# Patient Record
Sex: Female | Born: 1975 | Race: White | Hispanic: No | Marital: Single | Smoking: Former smoker
Health system: Southern US, Community
[De-identification: ages and names within clinical notes are randomized; demographics above are authoritative.]

## PROBLEM LIST (undated history)

## (undated) DIAGNOSIS — F410 Panic disorder [episodic paroxysmal anxiety] without agoraphobia: Secondary | ICD-10-CM

## (undated) DIAGNOSIS — F419 Anxiety disorder, unspecified: Secondary | ICD-10-CM

## (undated) DIAGNOSIS — F319 Bipolar disorder, unspecified: Secondary | ICD-10-CM

## (undated) DIAGNOSIS — J189 Pneumonia, unspecified organism: Secondary | ICD-10-CM

## (undated) DIAGNOSIS — J449 Chronic obstructive pulmonary disease, unspecified: Secondary | ICD-10-CM

## (undated) DIAGNOSIS — F909 Attention-deficit hyperactivity disorder, unspecified type: Secondary | ICD-10-CM

## (undated) DIAGNOSIS — F4310 Post-traumatic stress disorder, unspecified: Secondary | ICD-10-CM

## (undated) DIAGNOSIS — J45909 Unspecified asthma, uncomplicated: Secondary | ICD-10-CM

## (undated) HISTORY — PX: OTHER SURGICAL HISTORY: SHX169

## (undated) HISTORY — DX: Post-traumatic stress disorder, unspecified: F43.10

## (undated) HISTORY — PX: TUBAL LIGATION: SHX77

## (undated) HISTORY — PX: ABDOMINAL HYSTERECTOMY: SHX81

---

## 2010-05-14 ENCOUNTER — Institutional Professional Consult (permissible substitution) (INDEPENDENT_AMBULATORY_CARE_PROVIDER_SITE_OTHER): Payer: Self-pay | Admitting: Internal Medicine

## 2010-06-25 ENCOUNTER — Institutional Professional Consult (permissible substitution) (INDEPENDENT_AMBULATORY_CARE_PROVIDER_SITE_OTHER): Payer: Self-pay | Admitting: Internal Medicine

## 2010-09-12 ENCOUNTER — Ambulatory Visit (INDEPENDENT_AMBULATORY_CARE_PROVIDER_SITE_OTHER): Payer: Self-pay | Admitting: Internal Medicine

## 2010-10-23 ENCOUNTER — Encounter: Payer: Self-pay | Admitting: *Deleted

## 2010-10-23 ENCOUNTER — Emergency Department (HOSPITAL_COMMUNITY)
Admission: EM | Admit: 2010-10-23 | Discharge: 2010-10-23 | Disposition: A | Discharge status: Home / Self Care | Payer: Medicaid Other | Attending: Emergency Medicine | Admitting: Emergency Medicine

## 2010-10-23 DIAGNOSIS — N949 Unspecified condition associated with female genital organs and menstrual cycle: Secondary | ICD-10-CM | POA: Insufficient documentation

## 2010-10-23 DIAGNOSIS — F17200 Nicotine dependence, unspecified, uncomplicated: Secondary | ICD-10-CM | POA: Insufficient documentation

## 2010-10-23 DIAGNOSIS — Z202 Contact with and (suspected) exposure to infections with a predominantly sexual mode of transmission: Secondary | ICD-10-CM | POA: Insufficient documentation

## 2010-10-23 DIAGNOSIS — N751 Abscess of Bartholin's gland: Secondary | ICD-10-CM | POA: Insufficient documentation

## 2010-10-23 LAB — URINALYSIS, ROUTINE W REFLEX MICROSCOPIC
Bilirubin Urine: NEGATIVE
Nitrite: NEGATIVE
Specific Gravity, Urine: 1.01 (ref 1.005–1.030)
Urobilinogen, UA: 0.2 mg/dL (ref 0.0–1.0)

## 2010-10-23 LAB — PREGNANCY, URINE: Preg Test, Ur: NEGATIVE

## 2010-10-23 LAB — URINE MICROSCOPIC-ADD ON

## 2010-10-23 MED ORDER — IBUPROFEN 800 MG PO TABS: 800.0000 mg | ORAL_TABLET | Freq: Three times a day (TID) | ORAL | Status: AC

## 2010-10-23 MED ORDER — AZITHROMYCIN 250 MG PO TABS
1000.0000 mg | ORAL_TABLET | Freq: Once | ORAL | Status: AC
  Administered 2010-10-23: 1000 mg via ORAL
  Filled 2010-10-23: qty 4

## 2010-10-23 MED ORDER — CEFTRIAXONE SODIUM 250 MG IJ SOLR
250.0000 mg | Freq: Once | INTRAMUSCULAR | Status: AC
  Administered 2010-10-23: 250 mg via INTRAMUSCULAR
  Filled 2010-10-23: qty 250

## 2010-10-23 MED ORDER — TRAMADOL HCL 50 MG PO TABS
50.0000 mg | ORAL_TABLET | Freq: Once | ORAL | Status: AC
  Administered 2010-10-23: 50 mg via ORAL
  Filled 2010-10-23: qty 1

## 2010-10-23 NOTE — ED Notes (Signed)
Pt states was seen at [facility] 3 days ago for Vaginal cyst/ abscess and painful sores in mouth. Pts # 1 complaint today is no pain medication given for post treatment at previous provider. Pt has asked 4 times for pain meds states [place] Vicodin for pain and has had a previous drug problem. Pt has packing on rt side of labia toward vaginal opening.  J.Idol PA at bedside.  Packing removed and PA spoke with pt at length secondary to the importance of treatment compliance and STD education. Pt states feels better once the packing was removed.

## 2010-10-23 NOTE — ED Provider Notes (Signed)
History     Chief Complaint  Patient presents with  . Wound Check   Patient is a 35 y.o. female presenting with wound check. The history is provided by the patient.  Wound Check  She was treated in the ED 2 to 3 days ago (Patient was seen at an outside hospital 2 days ago,  and had a Bartholins cyst drained.  She presents for removal of packing. ). Previous treatment in the ED includes I&D of abscess (She also describes redness and tenderness of the roof of her mouth,  was diagnosed with herpangina and prescribed dukes mouthwash,  has not filled yet.  She does report having oral sex with partner.). There has been no treatment (she was prescribed docycycline but will get filled today.  Also was treated with Im rocephin,  but reports has had intercourse with her partner yesterday.  He has not been checked for stds.) since the wound repair. There has been no drainage from the wound. There is no redness present. The swelling has not changed. The pain has not changed.    History reviewed. No pertinent past medical history.  Past Surgical History  Procedure Date  . Tubal ligation     History reviewed. No pertinent family history.  History  Substance Use Topics  . Smoking status: Current Everyday Smoker -- 0.5 packs/day for 20 years    Types: Cigarettes  . Smokeless tobacco: Not on file  . Alcohol Use: No    OB History    Grav Para Term Preterm Abortions TAB SAB Ect Mult Living   4 4              Review of Systems  Constitutional: Negative for fever.  HENT: Negative for congestion, sore throat and neck pain.   Eyes: Negative.   Respiratory: Negative for chest tightness and shortness of breath.   Cardiovascular: Negative for chest pain.  Gastrointestinal: Negative for nausea and abdominal pain.  Genitourinary: Positive for vaginal pain. Negative for vaginal bleeding, difficulty urinating, menstrual problem and pelvic pain.  Musculoskeletal: Negative.  Negative for joint swelling  and arthralgias.  Skin: Negative.  Negative for rash and wound.  Neurological: Negative.  Negative for dizziness, weakness, light-headedness, numbness and headaches.  Hematological: Negative.   Psychiatric/Behavioral: Negative.     Physical Exam  BP 111/68  Pulse 97  Temp(Src) 98.4 F (36.9 C) (Oral)  Resp 18  Ht 5\' 2"  (1.575 m)  Wt 148 lb (67.132 kg)  BMI 27.07 kg/m2  SpO2 99%  LMP 10/09/2010  Physical Exam  Vitals reviewed. Constitutional: She is oriented to person, place, and time. She appears well-developed and well-nourished.  HENT:  Head: Normocephalic and atraumatic.  Mouth/Throat:    Eyes: Conjunctivae are normal.  Neck: Normal range of motion. No thyromegaly present.  Cardiovascular: Normal rate and regular rhythm.   Pulmonary/Chest: Effort normal and breath sounds normal. She has no wheezes.  Abdominal: Soft. Bowel sounds are normal. There is no tenderness.  Genitourinary:    There is tenderness on the right labia.  Musculoskeletal: Normal range of motion.  Neurological: She is alert and oriented to person, place, and time.  Skin: Skin is warm and dry.  Psychiatric: She has a normal mood and affect.    ED Course  Procedures  MDM  Patient was treated for gonorrhea with rocephin injection 2 days ago,  But reexposed to partner yesterday.  Will retreat for gc/chlamydia.  Patient encouraged to get her doxycycline filled as prescribed by her  provider at [facility].     Candis Musa, PA 10/26/10 1008
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
## 2010-10-23 NOTE — ED Notes (Signed)
Pt states that she had a cyst drained at [facility] on the 10/21/10 in her vaginal area. Pt wants packing removed. States that she wanted to come her and didn't want to go back to [place]. C/o pain with movement and urinating.

## 2010-10-28 NOTE — ED Provider Notes (Signed)
Medical screening examination/treatment/procedure(s) were performed by non-physician practitioner and as supervising physician I was immediately available for consultation/collaboration.   Lyanne Co, MD 10/28/10 [phone]

## 2011-08-27 ENCOUNTER — Emergency Department (HOSPITAL_COMMUNITY): Payer: Medicaid Other

## 2011-08-27 ENCOUNTER — Emergency Department (HOSPITAL_COMMUNITY)
Admission: EM | Admit: 2011-08-27 | Discharge: 2011-08-27 | Disposition: A | Discharge status: Home / Self Care | Payer: Medicaid Other | Attending: Emergency Medicine | Admitting: Emergency Medicine

## 2011-08-27 ENCOUNTER — Encounter (HOSPITAL_COMMUNITY): Payer: Self-pay

## 2011-08-27 DIAGNOSIS — F17200 Nicotine dependence, unspecified, uncomplicated: Secondary | ICD-10-CM | POA: Insufficient documentation

## 2011-08-27 DIAGNOSIS — F319 Bipolar disorder, unspecified: Secondary | ICD-10-CM | POA: Insufficient documentation

## 2011-08-27 DIAGNOSIS — R220 Localized swelling, mass and lump, head: Secondary | ICD-10-CM | POA: Insufficient documentation

## 2011-08-27 DIAGNOSIS — S0003XA Contusion of scalp, initial encounter: Secondary | ICD-10-CM | POA: Insufficient documentation

## 2011-08-27 DIAGNOSIS — S0083XA Contusion of other part of head, initial encounter: Secondary | ICD-10-CM | POA: Insufficient documentation

## 2011-08-27 DIAGNOSIS — F410 Panic disorder [episodic paroxysmal anxiety] without agoraphobia: Secondary | ICD-10-CM | POA: Insufficient documentation

## 2011-08-27 HISTORY — DX: Panic disorder (episodic paroxysmal anxiety): F41.0

## 2011-08-27 HISTORY — DX: Bipolar disorder, unspecified: F31.9

## 2011-08-27 HISTORY — DX: Anxiety disorder, unspecified: F41.9

## 2011-08-27 MED ORDER — PROMETHAZINE HCL 12.5 MG PO TABS
12.5000 mg | ORAL_TABLET | Freq: Once | ORAL | Status: AC
  Administered 2011-08-27: 12.5 mg via ORAL
  Filled 2011-08-27: qty 1

## 2011-08-27 MED ORDER — HYDROCODONE-ACETAMINOPHEN 5-325 MG PO TABS
2.0000 | ORAL_TABLET | Freq: Once | ORAL | Status: AC
  Administered 2011-08-27: 2 via ORAL
  Filled 2011-08-27: qty 2

## 2011-08-27 MED ORDER — HYDROCODONE-ACETAMINOPHEN 7.5-325 MG PO TABS: 1.0000 | ORAL_TABLET | ORAL | Status: AC | PRN

## 2011-08-27 NOTE — Discharge Instructions (Signed)
Your CT is negative for fracture or dislocation. There are areas of swelling and hematoma present. Please apply ice to the left face today and tomorrow for 15 minute intervals. Please use Tylenol or Motrin for mild pain, please use Norco for more severe pain. This medication may cause drowsiness, or constipation. Please use with caution.Contusion A contusion is a deep bruise. Contusions are the result of an injury that caused bleeding under the skin. The contusion may turn blue, purple, or yellow. Minor injuries will give you a painless contusion, but more severe contusions may stay painful and swollen for a few weeks.  CAUSES  A contusion is usually caused by a blow, trauma, or direct force to an area of the body. SYMPTOMS   Swelling and redness of the injured area.   Bruising of the injured area.   Tenderness and soreness of the injured area.   Pain.  DIAGNOSIS  The diagnosis can be made by taking a history and physical exam. An X-ray, CT scan, or MRI may be needed to determine if there were any associated injuries, such as fractures. TREATMENT  Specific treatment will depend on what area of the body was injured. In general, the best treatment for a contusion is resting, icing, elevating, and applying cold compresses to the injured area. Over-the-counter medicines may also be recommended for pain control. Ask your caregiver what the best treatment is for your contusion. HOME CARE INSTRUCTIONS   Put ice on the injured area.   Put ice in a plastic bag.   Place a towel between your skin and the bag.   Leave the ice on for 15 to 20 minutes, 3 to 4 times a day.   Only take over-the-counter or prescription medicines for pain, discomfort, or fever as directed by your caregiver. Your caregiver may recommend avoiding anti-inflammatory medicines (aspirin, ibuprofen, and naproxen) for 48 hours because these medicines may increase bruising.   Rest the injured area.   If possible, elevate the  injured area to reduce swelling.  SEEK IMMEDIATE MEDICAL CARE IF:   You have increased bruising or swelling.   You have pain that is getting worse.   Your swelling or pain is not relieved with medicines.  MAKE SURE YOU:   Understand these instructions.   Will watch your condition.   Will get help right away if you are not doing well or get worse.  Document Released: 01/02/2005 Document Revised: 03/14/2011 Document Reviewed: 01/28/2011 [facility] Patient Information 2012 [place], [place].
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
## 2011-08-31 NOTE — ED Provider Notes (Signed)
Medical screening examination/treatment/procedure(s) were performed by non-physician practitioner and as supervising physician I was immediately available for consultation/collaboration. Devoria Albe, MD, Armando Gang   Ward Givens, MD 08/31/11 2231

## 2013-01-10 ENCOUNTER — Encounter (HOSPITAL_COMMUNITY): Payer: Self-pay | Admitting: Emergency Medicine

## 2013-01-10 ENCOUNTER — Emergency Department (HOSPITAL_COMMUNITY)
Admission: EM | Admit: 2013-01-10 | Discharge: 2013-01-10 | Disposition: A | Discharge status: Home / Self Care | Payer: Medicaid Other | Attending: Emergency Medicine | Admitting: Emergency Medicine

## 2013-01-10 DIAGNOSIS — M545 Low back pain: Secondary | ICD-10-CM

## 2013-01-10 DIAGNOSIS — Y929 Unspecified place or not applicable: Secondary | ICD-10-CM | POA: Insufficient documentation

## 2013-01-10 DIAGNOSIS — Y9389 Activity, other specified: Secondary | ICD-10-CM | POA: Insufficient documentation

## 2013-01-10 DIAGNOSIS — X500XXA Overexertion from strenuous movement or load, initial encounter: Secondary | ICD-10-CM | POA: Insufficient documentation

## 2013-01-10 DIAGNOSIS — F17200 Nicotine dependence, unspecified, uncomplicated: Secondary | ICD-10-CM | POA: Insufficient documentation

## 2013-01-10 DIAGNOSIS — S93401A Sprain of unspecified ligament of right ankle, initial encounter: Secondary | ICD-10-CM

## 2013-01-10 DIAGNOSIS — Z8659 Personal history of other mental and behavioral disorders: Secondary | ICD-10-CM | POA: Insufficient documentation

## 2013-01-10 DIAGNOSIS — IMO0002 Reserved for concepts with insufficient information to code with codable children: Secondary | ICD-10-CM | POA: Insufficient documentation

## 2013-01-10 DIAGNOSIS — S93409A Sprain of unspecified ligament of unspecified ankle, initial encounter: Secondary | ICD-10-CM | POA: Insufficient documentation

## 2013-01-10 MED ORDER — ACETAMINOPHEN-CODEINE #3 300-30 MG PO TABS
2.0000 | ORAL_TABLET | Freq: Once | ORAL | Status: AC
Start: 2013-01-10 — End: 2013-01-10
  Administered 2013-01-10: 2 via ORAL
  Filled 2013-01-10: qty 2

## 2013-01-10 MED ORDER — DICLOFENAC SODIUM 75 MG PO TBEC: 75.0000 mg | DELAYED_RELEASE_TABLET | Freq: Two times a day (BID) | ORAL | Status: DC

## 2013-01-10 MED ORDER — ONDANSETRON HCL 4 MG PO TABS
4.0000 mg | ORAL_TABLET | Freq: Once | ORAL | Status: AC
  Administered 2013-01-10: 4 mg via ORAL
  Filled 2013-01-10: qty 1

## 2013-01-10 MED ORDER — KETOROLAC TROMETHAMINE 10 MG PO TABS
10.0000 mg | ORAL_TABLET | Freq: Once | ORAL | Status: AC
  Administered 2013-01-10: 10 mg via ORAL
  Filled 2013-01-10: qty 1

## 2013-01-10 MED ORDER — ACETAMINOPHEN-CODEINE #3 300-30 MG PO TABS: 1.0000 | ORAL_TABLET | Freq: Four times a day (QID) | ORAL | Status: DC | PRN

## 2013-01-10 NOTE — ED Notes (Signed)
Pt upset after ASO placed on her right ankle and crutches given. Pt asking to see DR, as she does not believe the ASO is going to be enough support for said injury. Pt asking for a " big boot" like she got in January for another injury. EDP notified.
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
## 2013-01-10 NOTE — ED Notes (Signed)
Pt asking to see EDP as she feels she needs pain medication before leaving the hospital.  EDP notified.
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
## 2013-01-12 NOTE — ED Provider Notes (Signed)
Medical screening examination/treatment/procedure(s) were performed by non-physician practitioner and as supervising physician I was immediately available for consultation/collaboration.  Reyaansh Merlo M Bee Hammerschmidt, MD 01/12/13 0647 

## 2014-02-07 ENCOUNTER — Encounter (HOSPITAL_COMMUNITY): Payer: Self-pay | Admitting: Emergency Medicine

## 2014-07-15 ENCOUNTER — Inpatient Hospital Stay (HOSPITAL_COMMUNITY)
Admission: EM | Admit: 2014-07-15 | Discharge: 2014-07-16 | DRG: 917 | Disposition: A | Discharge status: Other Acute Inpatient Hospital | Payer: Medicaid Other | Attending: Internal Medicine | Admitting: Internal Medicine

## 2014-07-15 ENCOUNTER — Encounter (HOSPITAL_COMMUNITY): Payer: Self-pay | Admitting: Emergency Medicine

## 2014-07-15 DIAGNOSIS — E876 Hypokalemia: Secondary | ICD-10-CM | POA: Diagnosis present

## 2014-07-15 DIAGNOSIS — F10129 Alcohol abuse with intoxication, unspecified: Secondary | ICD-10-CM | POA: Diagnosis present

## 2014-07-15 DIAGNOSIS — R404 Transient alteration of awareness: Secondary | ICD-10-CM | POA: Diagnosis not present

## 2014-07-15 DIAGNOSIS — F410 Panic disorder [episodic paroxysmal anxiety] without agoraphobia: Secondary | ICD-10-CM | POA: Diagnosis present

## 2014-07-15 DIAGNOSIS — F17210 Nicotine dependence, cigarettes, uncomplicated: Secondary | ICD-10-CM | POA: Diagnosis present

## 2014-07-15 DIAGNOSIS — T50904A Poisoning by unspecified drugs, medicaments and biological substances, undetermined, initial encounter: Secondary | ICD-10-CM | POA: Diagnosis not present

## 2014-07-15 DIAGNOSIS — I959 Hypotension, unspecified: Secondary | ICD-10-CM | POA: Diagnosis present

## 2014-07-15 DIAGNOSIS — G9340 Encephalopathy, unspecified: Secondary | ICD-10-CM | POA: Diagnosis present

## 2014-07-15 DIAGNOSIS — T50901A Poisoning by unspecified drugs, medicaments and biological substances, accidental (unintentional), initial encounter: Secondary | ICD-10-CM | POA: Diagnosis present

## 2014-07-15 DIAGNOSIS — T391X1A Poisoning by 4-Aminophenol derivatives, accidental (unintentional), initial encounter: Secondary | ICD-10-CM | POA: Diagnosis present

## 2014-07-15 DIAGNOSIS — I9589 Other hypotension: Secondary | ICD-10-CM

## 2014-07-15 DIAGNOSIS — T43211A Poisoning by selective serotonin and norepinephrine reuptake inhibitors, accidental (unintentional), initial encounter: Principal | ICD-10-CM | POA: Diagnosis present

## 2014-07-15 DIAGNOSIS — F314 Bipolar disorder, current episode depressed, severe, without psychotic features: Secondary | ICD-10-CM | POA: Diagnosis not present

## 2014-07-15 DIAGNOSIS — F515 Nightmare disorder: Secondary | ICD-10-CM | POA: Diagnosis present

## 2014-07-15 DIAGNOSIS — G92 Toxic encephalopathy: Secondary | ICD-10-CM | POA: Diagnosis present

## 2014-07-15 DIAGNOSIS — T43212A Poisoning by selective serotonin and norepinephrine reuptake inhibitors, intentional self-harm, initial encounter: Secondary | ICD-10-CM | POA: Diagnosis not present

## 2014-07-15 DIAGNOSIS — F319 Bipolar disorder, unspecified: Secondary | ICD-10-CM | POA: Diagnosis present

## 2014-07-15 DIAGNOSIS — Y906 Blood alcohol level of 120-199 mg/100 ml: Secondary | ICD-10-CM | POA: Diagnosis present

## 2014-07-15 DIAGNOSIS — R4182 Altered mental status, unspecified: Secondary | ICD-10-CM | POA: Diagnosis present

## 2014-07-15 DIAGNOSIS — T1491 Suicide attempt: Secondary | ICD-10-CM | POA: Diagnosis not present

## 2014-07-15 DIAGNOSIS — F1020 Alcohol dependence, uncomplicated: Secondary | ICD-10-CM | POA: Diagnosis not present

## 2014-07-15 DIAGNOSIS — F1920 Other psychoactive substance dependence, uncomplicated: Secondary | ICD-10-CM | POA: Diagnosis not present

## 2014-07-15 DIAGNOSIS — F1994 Other psychoactive substance use, unspecified with psychoactive substance-induced mood disorder: Secondary | ICD-10-CM | POA: Diagnosis not present

## 2014-07-15 DIAGNOSIS — F10920 Alcohol use, unspecified with intoxication, uncomplicated: Secondary | ICD-10-CM

## 2014-07-15 LAB — URINALYSIS, ROUTINE W REFLEX MICROSCOPIC
Bilirubin Urine: NEGATIVE
Glucose, UA: NEGATIVE mg/dL
HGB URINE DIPSTICK: NEGATIVE
Ketones, ur: NEGATIVE mg/dL
LEUKOCYTES UA: NEGATIVE
NITRITE: NEGATIVE
PROTEIN: NEGATIVE mg/dL
UROBILINOGEN UA: 0.2 mg/dL (ref 0.0–1.0)
pH: 5.5 (ref 5.0–8.0)

## 2014-07-15 LAB — COMPREHENSIVE METABOLIC PANEL
ALBUMIN: 3.8 g/dL (ref 3.5–5.2)
ALT: 31 U/L (ref 0–35)
AST: 31 U/L (ref 0–37)
Alkaline Phosphatase: 64 U/L (ref 39–117)
Anion gap: 12 (ref 5–15)
BUN: 5 mg/dL — ABNORMAL LOW (ref 6–23)
CO2: 23 mmol/L (ref 19–32)
CREATININE: 0.71 mg/dL (ref 0.50–1.10)
Calcium: 8.7 mg/dL (ref 8.4–10.5)
Chloride: 103 mmol/L (ref 96–112)
GFR calc Af Amer: >90 mL/min (ref >90)
GLUCOSE: 96 mg/dL (ref 70–99)
Potassium: 3 mmol/L — ABNORMAL LOW (ref 3.5–5.1)
SODIUM: 138 mmol/L (ref 135–145)
Total Bilirubin: 0.4 mg/dL (ref 0.3–1.2)
Total Protein: 7.4 g/dL (ref 6.0–8.3)

## 2014-07-15 LAB — CBC WITH DIFFERENTIAL/PLATELET
BASOS ABS: 0 10*3/uL (ref 0.0–0.1)
Basophils Relative: 0 % (ref 0–1)
Eosinophils Absolute: 0 10*3/uL (ref 0.0–0.7)
Eosinophils Relative: 0 % (ref 0–5)
HCT: 44 % (ref 36.0–46.0)
Hemoglobin: 15.7 g/dL — ABNORMAL HIGH (ref 12.0–15.0)
Lymphocytes Relative: 33 % (ref 12–46)
Lymphs Abs: 3 10*3/uL (ref 0.7–4.0)
MCH: 33.1 pg (ref 26.0–34.0)
MCHC: 35.7 g/dL (ref 30.0–36.0)
MCV: 92.8 fL (ref 78.0–100.0)
Monocytes Absolute: 0.5 10*3/uL (ref 0.1–1.0)
Monocytes Relative: 5 % (ref 3–12)
NEUTROS ABS: 5.5 10*3/uL (ref 1.7–7.7)
Neutrophils Relative %: 62 % (ref 43–77)
PLATELETS: 274 10*3/uL (ref 150–400)
RBC: 4.74 MIL/uL (ref 3.87–5.11)
RDW: 13.5 % (ref 11.5–15.5)
WBC: 9 10*3/uL (ref 4.0–10.5)

## 2014-07-15 LAB — ETHANOL: Alcohol, Ethyl (B): 141 mg/dL — ABNORMAL HIGH (ref 0–9)

## 2014-07-15 LAB — RAPID URINE DRUG SCREEN, HOSP PERFORMED
Amphetamines: NOT DETECTED
Barbiturates: NOT DETECTED
Benzodiazepines: NOT DETECTED
COCAINE: POSITIVE — AB
OPIATES: POSITIVE — AB
Tetrahydrocannabinol: NOT DETECTED

## 2014-07-15 LAB — ACETAMINOPHEN LEVEL

## 2014-07-15 LAB — POC URINE PREG, ED: Preg Test, Ur: NEGATIVE

## 2014-07-15 LAB — SALICYLATE LEVEL: Salicylate Lvl: <4 mg/dL (ref 2.8–20.0)

## 2014-07-15 LAB — MRSA PCR SCREENING: MRSA BY PCR: POSITIVE — AB

## 2014-07-15 MED ORDER — SODIUM CHLORIDE 0.9 % IV SOLN
INTRAVENOUS | Status: AC
  Administered 2014-07-15: 11:00:00 via INTRAVENOUS
  Administered 2014-07-15: 150 mL/h via INTRAVENOUS

## 2014-07-15 MED ORDER — NALOXONE HCL 1 MG/ML IJ SOLN
INTRAMUSCULAR | Status: AC
  Filled 2014-07-15: qty 4

## 2014-07-15 MED ORDER — NALOXONE HCL 1 MG/ML IJ SOLN
INTRAMUSCULAR | Status: AC
  Filled 2014-07-15: qty 2

## 2014-07-15 MED ORDER — SODIUM CHLORIDE 0.9 % IV BOLUS (SEPSIS)
1000.0000 mL | Freq: Once | INTRAVENOUS | Status: AC
  Administered 2014-07-15: 1000 mL via INTRAVENOUS

## 2014-07-15 MED ORDER — SODIUM CHLORIDE 0.9 % IV SOLN
1000.0000 mL | Freq: Once | INTRAVENOUS | Status: AC
  Administered 2014-07-15: 1000 mL via INTRAVENOUS

## 2014-07-15 MED ORDER — NALOXONE HCL 1 MG/ML IJ SOLN
2.0000 mg | Freq: Once | INTRAMUSCULAR | Status: AC
  Administered 2014-07-15: 2 mg via INTRAVENOUS

## 2014-07-15 MED ORDER — SODIUM CHLORIDE 0.9 % IJ SOLN: 3.0000 mL | Freq: Two times a day (BID) | INTRAMUSCULAR | Status: DC

## 2014-07-15 MED ORDER — SODIUM CHLORIDE 0.9 % IV SOLN: 250.0000 mL | INTRAVENOUS | Status: DC | PRN

## 2014-07-15 MED ORDER — ONDANSETRON HCL 4 MG/2ML IJ SOLN: 4.0000 mg | Freq: Four times a day (QID) | INTRAMUSCULAR | Status: DC | PRN

## 2014-07-15 MED ORDER — MUPIROCIN 2 % EX OINT
1.0000 "application " | TOPICAL_OINTMENT | Freq: Two times a day (BID) | CUTANEOUS | Status: DC
  Administered 2014-07-15 – 2014-07-16 (×3): 1 via NASAL
  Filled 2014-07-15: qty 22

## 2014-07-15 MED ORDER — CHLORHEXIDINE GLUCONATE CLOTH 2 % EX PADS
6.0000 | MEDICATED_PAD | Freq: Every day | CUTANEOUS | Status: DC
  Administered 2014-07-15 – 2014-07-16 (×2): 6 via TOPICAL

## 2014-07-15 MED ORDER — SODIUM CHLORIDE 0.9 % IV SOLN
1000.0000 mL | INTRAVENOUS | Status: DC
  Administered 2014-07-15 – 2014-07-16 (×3): 1000 mL via INTRAVENOUS

## 2014-07-15 MED ORDER — ENOXAPARIN SODIUM 40 MG/0.4ML ~~LOC~~ SOLN
40.0000 mg | SUBCUTANEOUS | Status: DC
  Administered 2014-07-15: 40 mg via SUBCUTANEOUS
  Filled 2014-07-15 (×2): qty 0.4

## 2014-07-15 MED ORDER — SODIUM CHLORIDE 0.9 % IJ SOLN
3.0000 mL | INTRAMUSCULAR | Status: DC | PRN
Start: 2014-07-15 — End: 2014-07-16

## 2014-07-15 MED ORDER — POTASSIUM CHLORIDE 10 MEQ/100ML IV SOLN
10.0000 meq | Freq: Once | INTRAVENOUS | Status: AC
  Administered 2014-07-15: 10 meq via INTRAVENOUS
  Filled 2014-07-15: qty 100

## 2014-07-15 MED ORDER — POTASSIUM CHLORIDE 10 MEQ/100ML IV SOLN
10.0000 meq | INTRAVENOUS | Status: AC
  Administered 2014-07-15 (×3): 10 meq via INTRAVENOUS
  Filled 2014-07-15 (×3): qty 100

## 2014-07-15 MED ORDER — NALOXONE HCL 1 MG/ML IJ SOLN
1.3000 mg/h | INTRAVENOUS | Status: DC
  Administered 2014-07-15 (×3): 1.3 mg/h via INTRAVENOUS
  Filled 2014-07-15 (×2): qty 4

## 2014-07-15 MED ORDER — ONDANSETRON HCL 4 MG PO TABS: 4.0000 mg | ORAL_TABLET | Freq: Four times a day (QID) | ORAL | Status: DC | PRN
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
## 2014-07-15 NOTE — Plan of Care (Signed)
Problem: Phase I Progression Outcomes Goal: OOB as tolerated unless otherwise ordered Outcome: Progressing Pt remains drowsy but arousable.

## 2014-07-15 NOTE — ED Provider Notes (Signed)
CSN: [id number]     Arrival date & time 07/15/14  0050 History   First MD Initiated Contact with Patient 07/15/14 0055     Chief Complaint  Patient presents with  . Drug Overdose     (Consider location/radiation/quality/duration/timing/severity/associated sxs/prior Treatment) Patient is a 39 y.o. female presenting with Overdose. The history is provided by the EMS personnel and the patient. The history is limited by the condition of the patient (Altered mental status).  Drug Overdose  She was brought to the ED by ambulance after apparently taking an overdose of trazodone and opioids as well as consuming alcohol. Patient states that she took 2 trazodone admitted to drinking but denies taking any other medications. EMS did treat her with naloxone 2 mg with improvement in mental status, but she is starting to become more up tended again. Patient denies suicidal intent.  Past Medical History  Diagnosis Date  . Bipolar 1 disorder   . Panic attack   . Anxiety    Past Surgical History  Procedure Laterality Date  . Tubal ligation     History reviewed. No pertinent family history. History  Substance Use Topics  . Smoking status: Current Every Day Smoker -- 0.50 packs/day for 20 years    Types: Cigarettes  . Smokeless tobacco: Never Used  . Alcohol Use: No   OB History    Gravida Para Term Preterm AB TAB SAB Ectopic Multiple Living   Review of Systems  Unable to perform ROS: Mental status change      Allergies  Geodon  Home Medications   Prior to Admission medications   Medication Sig Start Date End Date Taking? Authorizing Provider  acetaminophen-codeine (TYLENOL #3) 300-30 MG per tablet Take 1-2 tablets by mouth every 6 (six) hours as needed for pain. 01/10/13   Ivery Quale, PA-C  diclofenac (VOLTAREN) 75 MG EC tablet Take 1 tablet (75 mg total) by mouth 2 (two) times daily. 01/10/13   Ivery Quale, PA-C   BP 86/51 mmHg  Pulse 76  Temp(Src) 98.6 F (37  C) (Oral)  Resp 16  Ht  (1.575 m)  Wt 150 lb (68.04 kg)  BMI 27.43 kg/m2  SpO2 97% Physical Exam  Nursing note and vitals reviewed.  39 year old female, somnolent but arousable, and in no acute distress. Vital signs are normal. Oxygen saturation is 96%, which is normal. Head is normocephalic and atraumatic. PERRLA, EOMI. Oropharynx is clear. Neck is nontender and supple without adenopathy or JVD. Back is nontender and there is no CVA tenderness. Lungs are clear without rales, wheezes, or rhonchi. Chest is nontender. Heart has regular rate and rhythm without murmur. Abdomen is soft, flat, nontender without masses or hepatosplenomegaly and peristalsis is normoactive. Extremities have no cyanosis or edema, full range of motion is present. Skin is warm and dry without rash. Neurologic: She is somnolent but arousable, speech is slurred, cranial nerves are intact, there are no motor or sensory deficits.  ED Course  Procedures (including critical care time) Labs Review Results for orders placed or performed during the hospital encounter of 07/15/14  Acetaminophen level  Result Value Ref Range   Acetaminophen (Tylenol), Serum <10.0 (L) 10 - 30 ug/mL  Comprehensive metabolic panel  Result Value Ref Range   Sodium 138 135 - 145 mmol/L   Potassium 3.0 (L) 3.5 - 5.1 mmol/L   Chloride 103 96 - 112 mmol/L   CO2  23 19 - 32 mmol/L   Glucose, Bld 96 70 - 99 mg/dL   BUN 5 (L) 6 - 23 mg/dL   Creatinine, Ser 4.090.71 0.50 - 1.10 mg/dL   Calcium 8.7 8.4 - 81.110.5 mg/dL   Total Protein 7.4 6.0 - 8.3 g/dL   Albumin 3.8 3.5 - 5.2 g/dL   AST 31 0 - 37 U/L   ALT 31 0 - 35 U/L   Alkaline Phosphatase 64 39 - 117 U/L   Total Bilirubin 0.4 0.3 - 1.2 mg/dL   GFR calc non Af Amer >90 >90 mL/min   GFR calc Af Amer >90 >90 mL/min   Anion gap 12 5 - 15  Ethanol  Result Value Ref Range   Alcohol, Ethyl (B) 141 (H) 0 - 9 mg/dL  Salicylate level  Result Value Ref Range   Salicylate Lvl <4.0 2.8 - 20.0  mg/dL  CBC with Differential  Result Value Ref Range   WBC 9.0 4.0 - 10.5 K/uL   RBC 4.74 3.87 - 5.11 MIL/uL   Hemoglobin 15.7 (H) 12.0 - 15.0 g/dL   HCT 91.444.0 78.236.0 - 95.646.0 %   MCV 92.8 78.0 - 100.0 fL   MCH 33.1 26.0 - 34.0 pg   MCHC 35.7 30.0 - 36.0 g/dL   RDW 21.313.5 08.611.5 - 57.815.5 %   Platelets 274 150 - 400 K/uL   Neutrophils Relative % 62 43 - 77 %   Neutro Abs 5.5 1.7 - 7.7 K/uL   Lymphocytes Relative 33 12 - 46 %   Lymphs Abs 3.0 0.7 - 4.0 K/uL   Monocytes Relative 5 3 - 12 %   Monocytes Absolute 0.5 0.1 - 1.0 K/uL   Eosinophils Relative 0 0 - 5 %   Eosinophils Absolute 0.0 0.0 - 0.7 K/uL   Basophils Relative 0 0 - 1 %   Basophils Absolute 0.0 0.0 - 0.1 K/uL  POC urine preg, ED  Result Value Ref Range   Preg Test, Ur NEGATIVE NEGATIVE    EKG Interpretation   Date/Time:  Friday July 15 2014 01:10:04 EDT Ventricular Rate:  75 PR Interval:  135 QRS Duration: 75 QT Interval:  427 QTC Calculation: 477 R Axis:   60 Text Interpretation:  Sinus rhythm Normal ECG No old tracing to compare  Confirmed by [facility]  MD, Alante Tolan ([id number]) on 07/15/2014 1:12:08 AM      CRITICAL CARE Performed by: XBMWU,XLKGMGLICK,Ensley Blas Total critical care time: 90 minutes Critical care time was exclusive of separately billable procedures and treating other patients. Critical care was necessary to treat or prevent imminent or life-threatening deterioration. Critical care was time spent personally by me on the following activities: development of treatment plan with patient and/or surrogate as well as nursing, discussions with consultants, evaluation of patient's response to treatment, examination of patient, obtaining history from patient or surrogate, ordering and performing treatments and interventions, ordering and review of laboratory studies, ordering and review of radiographic studies, pulse oximetry and re-evaluation of patient's condition.  MDM   Final diagnoses:  Drug overdose, undetermined intent, initial  encounter  Hypokalemia  Alcohol intoxication, uncomplicated  Other specified hypotension    Drug overdose. Response and all oxygen indicates narcotics were among the things that she total. Screening labs have been obtained and she will be started on a naloxone infusion. Old records are reviewed and she has no relevant past visits. [place] shows last narcotic prescription was filled to  break 22nd for 10 oxycodone-acetaminophen 5-325, but she is getting monthly prescriptions for 120 alprazolam 1 mg tablets with the last prescription filled on March 28.  Alcohol level has come back only moderately elevated. Acetaminophen and salicylate are undetectable. Hyperkalemia started and she is given intravenous potassium. Lactic acid level is still pending. Blood pressure did drop into the 80s and she is given additional IV fluids. She remains somewhat somnolent but continues to maintain adequate oxygen saturation and is protecting her airway. Nursing apparently had contacted the patient's mother who stated that she cut her wrist several days ago and had been expressing suicidal and homicidal ideation at home. Mother has not been in the room at any time that I've been in to see her. Case is discussed with Dr. Sharl Ma of [facility] who agrees to admit the patient.  Dione Booze, MD 07/15/14 [phone]

## 2014-07-15 NOTE — Plan of Care (Signed)
Problem: Phase I Progression Outcomes Goal: Other Phase I Outcomes/Goals Outcome: Not Progressing Pt remians drowsy from over dose. Remains on iv narcan drip and has a suicide sitter at bedside.

## 2014-07-15 NOTE — Progress Notes (Signed)
NARCAN DRIP STOPPED PER VERBAL ORDER WHEN CURRENT BAG COMPLETED.

## 2014-07-15 NOTE — ED Notes (Signed)
Patient ambulatory to and from restroom.

## 2014-07-15 NOTE — ED Notes (Signed)
Pt has taken unknown amount of trazadone, opioids, and etoh.

## 2014-07-15 NOTE — Clinical Social Work Note (Signed)
Patient is currently on Narcan drip and is not currently able to participate in her assessment. Patient will need TTS consult.    Tretha SciaraHeather Taisei Bonnette, Kentucky[place]CS[place] [phone]

## 2014-07-15 NOTE — Progress Notes (Signed)
Patient seen and examined. Admitted earlier today with a trazodone and Percocet overdose. Unknown if this was a suicide attempt. Patient remains with encephalopathy related to this. Continue care in the ICU on a Narcan drip for now. Will consider psychiatry evaluation once patient more alert. Since she took an unknown quantity of Percocet, will check Tylenol levels. It was noted to be less than 10 at 1 AM. Hypokalemia has been repleted; recheck be met in the morning.  Domingo Mend, MD [facility] Pager: [phone]

## 2014-07-15 NOTE — Progress Notes (Signed)
PT CONTINUES TO SLEEP THROUGH THE DAY. VS ARE STABLE. NORMAL RESPIRATIONS,O2 SAT 96 TO 98% ON ROOM AIR. NO DISTRESS OBSERVED. SKIN REMAINS WARM AND DRY.PT IS AROUSABLE. SUICIDE SITTER REMAINS AT BEDSIDE 1:1 FOR PT'S SAFETY.

## 2014-07-15 NOTE — Care Management Utilization Note (Signed)
UR completed 

## 2014-07-15 NOTE — H&P (Signed)
PCP:   No PCP Per Patient   Chief Complaint:  Altered mental status  HPI: 39 year old female who   has a past medical history of Bipolar 1 disorder; Panic attack; and Anxiety. today was brought to the ED by ambulance after patient took overdose of trazodone and opioids, as well as consuming alcohol. Patient is currently somnolent and arousable but unable to provide any significant history. Patient was given naloxone by the EMS, which improved her mental status. Patient was started on naloxone infusion by the ED physician as patient continued to be lethargic. As per the nursing notes, patient's mother states that patient cut her wrist 2 days ago. There is suicidal ideation with nightmares. Dr. Preston Fleeting reviewed [facility] controlled substance reporting website, which showed last now or depression the patient was filled on 22nd for  10 oxycodone acetaminophen, also getting monthly prescriptions for 121 is from 1 mg tablet with last prescription filled on March 28. There is no family present at bedside  So history was  obtained from the ED notes. Allergies:   Allergies  Allergen Reactions  . Geodon [Ziprasidone Hydrochloride] Other (See Comments)    Patient states that she swallows tongue      Past Medical History  Diagnosis Date  . Bipolar 1 disorder   . Panic attack   . Anxiety     Past Surgical History  Procedure Laterality Date  . Tubal ligation      Prior to Admission medications   Medication Sig Start Date End Date Taking? Authorizing Provider  acetaminophen-codeine (TYLENOL #3) 300-30 MG per tablet Take 1-2 tablets by mouth every 6 (six) hours as needed for pain. 01/10/13   Ivery Quale, PA-C  diclofenac (VOLTAREN) 75 MG EC tablet Take 1 tablet (75 mg total) by mouth 2 (two) times daily. 01/10/13   Ivery Quale, PA-C    Social History:  reports that she has been smoking Cigarettes.  She has a 10 pack-year smoking history. She has never used smokeless tobacco. She  reports that she does not drink alcohol or use illicit drugs.  Family history Unobtainable at this time due to patient's altered mental status.  Review of Systems:  Unable to obtain due to the patient's altered mental status.   Physical Exam: Blood pressure 86/51, pulse 76, temperature 98.6 F (37 C), temperature source Oral, resp. rate 16, height  (1.575 m), weight 68.04 kg (150 lb), SpO2 97 %. Constitutional:   Patient is a well-developed and well-nourished female in no acute distress and lethargic Head: Normocephalic and atraumatic Mouth: Mucus membranes moist Eyes: PERRL, EOMI, conjunctivae normal Neck: Supple, No Thyromegaly Cardiovascular: RRR, S1 normal, S2 normal Pulmonary/Chest: CTAB, no wheezes, rales, or rhonchi Abdominal: Soft. Non-tender, non-distended, bowel sounds are normal, no masses, organomegaly, or guarding present.  Neurological: Somnolent but arousable, moving all extremities.r deficit, sensory intact to light touch bilaterally.  Extremities : No Cyanosis, Clubbing or Edema  Labs on Admission:  Basic Metabolic Panel:  Recent Labs Lab 07/15/14 0120  NA 138  K 3.0*  CL 103  CO2 23  GLUCOSE 96  BUN 5*  CREATININE 0.71  CALCIUM 8.7   Liver Function Tests:  Recent Labs Lab 07/15/14 0120  AST 31  ALT 31  ALKPHOS 64  BILITOT 0.4  PROT 7.4  ALBUMIN 3.8   No results for input(s): LIPASE, AMYLASE in the last 168 hours. No results for input(s): AMMONIA in the last 168 hours. CBC:  Recent Labs Lab 07/15/14 0120  WBC  9.0  NEUTROABS 5.5  HGB 15.7*  HCT 44.0  MCV 92.8  PLT 274    Radiological Exams on Admission: No results found.  EKG: Independently reviewed.    Assessment/Plan Active Problems:   Drug overdose   Altered mental status  Altered mental status Patient presenting with altered mental status due to the opiods . Overdose as well as trazodone, patient started on the naloxone  infusion. Will continue with naloxone  infusion the patient becomes more alert. Patient will benefit from psychiatric consultation in a.m. no patient unable to tell whether she had suicidal ideation are not. She will need psych evaluation a.m.Marland Kitchen.  Hypokalemia Replace potassium. Check BMP in a.m.  Hypotension Patient blood pressure dropped in the ED in 80s,  continue with IV fluids.   DVT prophylaxis Lovenox  Code status: Full code  Family discussion: No family at bedside.   Time Spent on Admission: 55 min  Lillar Bianca S [facility] Pager: [phone] 07/15/2014, 3:54 AM  If 7PM-7AM, please contact night-coverage  [email or web address]  Password [email or web address]

## 2014-07-15 NOTE — Care Management Note (Signed)
    Page 1 of 1   07/15/2014     1:56:37 PM CARE MANAGEMENT NOTE 07/15/2014  Patient:  Amy Cummings,Amy Cummings   Account Number:  [id number]  Date Initiated:  07/15/2014  Documentation initiated by:  Kathyrn SheriffHILDRESS,JESSICA  Subjective/Objective Assessment:   Pt is from home and admitted for overdose. Pt is lethargic at this time and CM unable to complete assessment. Per H&P pt has no PCP. Since CM unable to speak with pt today and at this time disposition is pending, will leave pt information     Action/Plan:   [facility]. CM will cont to follow if pt is not discharged over weekend. Discharge home vs inpatient psych, unable to complete TTS at this time to determine needs.   Anticipated DC Date:  07/18/2014   Anticipated DC Plan:  [facility]  In-house referral  Clinical Social Worker      DC Planning Services  CM consult      Choice offered to / List presented to:             Status of service:  Completed, signed off Medicare Important Message given?   (If response is "NO", the following Medicare IM given date fields will be blank) Date Medicare IM given:   Medicare IM given by:   Date Additional Medicare IM given:   Additional Medicare IM given by:    Discharge Disposition:    Per UR Regulation:  Reviewed for med. necessity/level of care/duration of stay  If discussed at Long Length of Stay Meetings, dates discussed:    Comments:  07/15/2014 1330 Kathyrn SheriffJessica Childress, RN, MSN, CM

## 2014-07-15 NOTE — ED Notes (Signed)
Patient ambulatory to restroom to collect urine sample 

## 2014-07-16 ENCOUNTER — Encounter (HOSPITAL_COMMUNITY): Payer: Self-pay | Admitting: *Deleted

## 2014-07-16 ENCOUNTER — Inpatient Hospital Stay (HOSPITAL_COMMUNITY)
Admission: AD | Admit: 2014-07-16 | Discharge: 2014-07-19 | DRG: 885 | Disposition: A | Discharge status: Home / Self Care | Payer: MEDICAID | Source: Intra-hospital | Attending: Psychiatry | Admitting: Psychiatry

## 2014-07-16 DIAGNOSIS — F1020 Alcohol dependence, uncomplicated: Secondary | ICD-10-CM | POA: Diagnosis present

## 2014-07-16 DIAGNOSIS — F1994 Other psychoactive substance use, unspecified with psychoactive substance-induced mood disorder: Secondary | ICD-10-CM | POA: Diagnosis not present

## 2014-07-16 DIAGNOSIS — F4310 Post-traumatic stress disorder, unspecified: Secondary | ICD-10-CM | POA: Diagnosis present

## 2014-07-16 DIAGNOSIS — F319 Bipolar disorder, unspecified: Secondary | ICD-10-CM | POA: Diagnosis present

## 2014-07-16 DIAGNOSIS — Y906 Blood alcohol level of 120-199 mg/100 ml: Secondary | ICD-10-CM | POA: Diagnosis present

## 2014-07-16 DIAGNOSIS — F515 Nightmare disorder: Secondary | ICD-10-CM | POA: Diagnosis present

## 2014-07-16 DIAGNOSIS — F1920 Other psychoactive substance dependence, uncomplicated: Secondary | ICD-10-CM | POA: Diagnosis present

## 2014-07-16 DIAGNOSIS — F17210 Nicotine dependence, cigarettes, uncomplicated: Secondary | ICD-10-CM | POA: Diagnosis present

## 2014-07-16 DIAGNOSIS — T43212A Poisoning by selective serotonin and norepinephrine reuptake inhibitors, intentional self-harm, initial encounter: Secondary | ICD-10-CM | POA: Diagnosis not present

## 2014-07-16 DIAGNOSIS — F314 Bipolar disorder, current episode depressed, severe, without psychotic features: Secondary | ICD-10-CM | POA: Diagnosis not present

## 2014-07-16 DIAGNOSIS — G9340 Encephalopathy, unspecified: Secondary | ICD-10-CM | POA: Diagnosis not present

## 2014-07-16 DIAGNOSIS — T1491 Suicide attempt: Secondary | ICD-10-CM | POA: Diagnosis not present

## 2014-07-16 DIAGNOSIS — F1120 Opioid dependence, uncomplicated: Secondary | ICD-10-CM | POA: Diagnosis present

## 2014-07-16 DIAGNOSIS — G4700 Insomnia, unspecified: Secondary | ICD-10-CM | POA: Diagnosis present

## 2014-07-16 DIAGNOSIS — F410 Panic disorder [episodic paroxysmal anxiety] without agoraphobia: Secondary | ICD-10-CM | POA: Diagnosis present

## 2014-07-16 DIAGNOSIS — F3130 Bipolar disorder, current episode depressed, mild or moderate severity, unspecified: Secondary | ICD-10-CM | POA: Diagnosis present

## 2014-07-16 DIAGNOSIS — T50904A Poisoning by unspecified drugs, medicaments and biological substances, undetermined, initial encounter: Secondary | ICD-10-CM | POA: Diagnosis not present

## 2014-07-16 LAB — ACETAMINOPHEN LEVEL: Acetaminophen (Tylenol), Serum: <10 ug/mL — ABNORMAL LOW (ref 10–30)

## 2014-07-16 MED ORDER — TRAZODONE HCL 50 MG PO TABS
50.0000 mg | ORAL_TABLET | Freq: Every evening | ORAL | Status: DC | PRN
  Administered 2014-07-16: 50 mg via ORAL
  Filled 2014-07-16: qty 1

## 2014-07-16 MED ORDER — CHLORDIAZEPOXIDE HCL 25 MG PO CAPS
25.0000 mg | ORAL_CAPSULE | Freq: Once | ORAL | Status: AC
  Administered 2014-07-16: 25 mg via ORAL
  Filled 2014-07-16: qty 1

## 2014-07-16 MED ORDER — CHLORDIAZEPOXIDE HCL 25 MG PO CAPS
25.0000 mg | ORAL_CAPSULE | Freq: Four times a day (QID) | ORAL | Status: AC
  Administered 2014-07-16 – 2014-07-18 (×6): 25 mg via ORAL
  Filled 2014-07-16 (×6): qty 1

## 2014-07-16 MED ORDER — ADULT MULTIVITAMIN W/MINERALS CH
1.0000 | ORAL_TABLET | Freq: Every day | ORAL | Status: DC
  Administered 2014-07-17 – 2014-07-19 (×3): 1 via ORAL
  Filled 2014-07-16 (×6): qty 1

## 2014-07-16 MED ORDER — LOPERAMIDE HCL 2 MG PO CAPS
2.0000 mg | ORAL_CAPSULE | ORAL | Status: DC | PRN
  Administered 2014-07-19: 4 mg via ORAL
  Filled 2014-07-16: qty 2

## 2014-07-16 MED ORDER — HYDROXYZINE HCL 25 MG PO TABS
25.0000 mg | ORAL_TABLET | Freq: Four times a day (QID) | ORAL | Status: DC | PRN
  Administered 2014-07-17 – 2014-07-18 (×2): 25 mg via ORAL
  Filled 2014-07-16 (×2): qty 1

## 2014-07-16 MED ORDER — CHLORDIAZEPOXIDE HCL 25 MG PO CAPS
25.0000 mg | ORAL_CAPSULE | Freq: Four times a day (QID) | ORAL | Status: DC | PRN
  Administered 2014-07-16: 25 mg via ORAL
  Filled 2014-07-16: qty 1

## 2014-07-16 MED ORDER — CHLORDIAZEPOXIDE HCL 25 MG PO CAPS: 25.0000 mg | ORAL_CAPSULE | ORAL | Status: DC

## 2014-07-16 MED ORDER — ONDANSETRON 4 MG PO TBDP: 4.0000 mg | ORAL_TABLET | Freq: Four times a day (QID) | ORAL | Status: DC | PRN

## 2014-07-16 MED ORDER — CHLORDIAZEPOXIDE HCL 25 MG PO CAPS: 25.0000 mg | ORAL_CAPSULE | Freq: Four times a day (QID) | ORAL | Status: DC | PRN

## 2014-07-16 MED ORDER — CHLORDIAZEPOXIDE HCL 25 MG PO CAPS
25.0000 mg | ORAL_CAPSULE | Freq: Three times a day (TID) | ORAL | Status: AC
  Administered 2014-07-18 – 2014-07-19 (×2): 25 mg via ORAL
  Filled 2014-07-16 (×2): qty 1

## 2014-07-16 MED ORDER — VITAMIN B-1 100 MG PO TABS
100.0000 mg | ORAL_TABLET | Freq: Every day | ORAL | Status: DC
  Administered 2014-07-17 – 2014-07-19 (×3): 100 mg via ORAL
  Filled 2014-07-16 (×6): qty 1

## 2014-07-16 MED ORDER — CHLORDIAZEPOXIDE HCL 25 MG PO CAPS: 25.0000 mg | ORAL_CAPSULE | Freq: Every day | ORAL | Status: DC

## 2014-07-16 MED ORDER — NICOTINE 21 MG/24HR TD PT24
21.0000 mg | MEDICATED_PATCH | Freq: Every day | TRANSDERMAL | Status: DC
  Administered 2014-07-16 – 2014-07-19 (×4): 21 mg via TRANSDERMAL
  Filled 2014-07-16 (×8): qty 1

## 2014-07-16 NOTE — BH Assessment (Signed)
Tele Assessment Note   Amy BryantMisty Cummings is an 39 y.o. female who presents to [facility] with the presenting problems of exacerbated depressive symptoms and suicide attempt through overdose on Trazodone and Percocet. Patient exhibited a depressed mood with congruent affect throughout the assessment AEB poor eye contact and limited engagement. Patient reports that she overdosed on pills while being intoxicated prior to her admission. She states that she has been depressed for several weeks but was unable to identify specific triggers that increased her depression. Patient states that she stopped taking her psychiatric medications two weeks for unspecified reasons. Patient reports that she has a poor relationship with her mother and that her mother has recently moved in with her which has caused additional issues within her home. Patient states that two weeks ago she attempted suicide through lacerations of her wrists but denies any hospitalization from this occurrence. Patient reports a family history of depression and anxiety that includes her mother and grandmother. Patient endorses depressive symptoms that include: insomnia, social isolation, withdrawal from usual pleasures and social activities, in addition to feelings of irritability. Patient exhibits maladaptive coping skills as she reports using alcohol daily in addition to smoking marijuana and using crack cocaine when it is accessible. Patient reports previous inpatient treatment at [facility] and [facility] in the past for similar issues. Patient states that she is connected with [facility] and that she sees Dr. Betti Cruzeddy for medication management. Patient denies HI/AVH at this time.   Axis I: Bipolar, Depressed Axis II: Deferred Axis III:  Past Medical History  Diagnosis Date  . Bipolar 1 disorder   . Panic attack   . Anxiety    Axis IV: other psychosocial or environmental problems, problems related to  social environment and problems with primary support group Axis V: 11-20 some danger of hurting self or others possible OR occasionally fails to maintain minimal personal hygiene OR gross impairment in communication  Past Medical History:  Past Medical History  Diagnosis Date  . Bipolar 1 disorder   . Panic attack   . Anxiety     Past Surgical History  Procedure Laterality Date  . Tubal ligation      Family History: History reviewed. No pertinent family history.  Social History:  reports that she has been smoking Cigarettes.  She has a 20 pack-year smoking history. She has never used smokeless tobacco. She reports that she drinks alcohol. She reports that she uses illicit drugs ("Crack" cocaine and Marijuana).  Additional Social History:  Alcohol / Drug Use History of alcohol / drug use?: Yes Substance #1 Name of Substance 1: ETOH 1 - Age of First Use: 14 1 - Amount (size/oz): varies 1 - Frequency: daily 1 - Duration: years 1 - Last Use / Amount: 07/14/14- eight 40oz Substance #2 Name of Substance 2: THC 2 - Age of First Use: 13 2 - Amount (size/oz): "couple of joints" 2 - Frequency: daily 2 - Duration: years 2 - Last Use / Amount: 07/14/14- "couple of joints" Substance #3 Name of Substance 3: Crack Cocaine 3 - Age of First Use: 14 3 - Amount (size/oz): varies 3 - Frequency: daily 3 - Duration: years 3 - Last Use / Amount: 07/14/14- amount unknown   CIWA: CIWA-Ar BP: 133/74 mmHg Pulse Rate: 75 COWS:    PATIENT STRENGTHS: (choose at least two) Ability for insight  Allergies:  Allergies  Allergen Reactions  . Geodon [Ziprasidone Hydrochloride] Other (See Comments)    Patient states  that she swallows tongue    Home Medications:  Medications Prior to Admission  Medication Sig Dispense Refill  . acetaminophen-codeine (TYLENOL #3) 300-30 MG per tablet Take 1-2 tablets by mouth every 6 (six) hours as needed for pain. 20 tablet 0  . diclofenac (VOLTAREN) 75 MG EC  tablet Take 1 tablet (75 mg total) by mouth 2 (two) times daily. 14 tablet 0    OB/GYN Status:  No LMP recorded.  General Assessment Data Location of Assessment: [facility] Assessment Services Is this a Tele or Face-to-Face Assessment?: Tele Assessment Is this an Initial Assessment or a Re-assessment for this encounter?: Initial Assessment Living Arrangements: Spouse/significant other, Children Can pt return to current living arrangement?: Yes Admission Status: Voluntary Is patient capable of signing voluntary admission?: Yes Transfer from: Acute Hospital Referral Source: Self/Family/Friend     [facility] Crisis Care Plan Living Arrangements: Spouse/significant other, Children Name of Psychiatrist: Dr. Reddy-[facility]  Name of Therapist: None  Education Status Is patient currently in school?: No  Risk to self with the past 6 months Suicidal Ideation: No-Not Currently/Within Last 6 Months Suicidal Intent: Yes-Currently Present Is patient at risk for suicide?: Yes Suicidal Plan?: Yes-Currently Present Specify Current Suicidal Plan: Plan through overdose Access to Means: Yes Specify Access to Suicidal Means: Access to pills What has been your use of drugs/alcohol within the last 12 months?: ETOH, THC, and Crack cocaine Previous Attempts/Gestures: Yes How many times?:  (pt would not specify) Triggers for Past Attempts: Unpredictable Intentional Self Injurious Behavior: None Family Suicide History: Unknown Recent stressful life event(s): Conflict (Comment) Persecutory voices/beliefs?: No Depression: Yes Depression Symptoms: Insomnia, Isolating, Loss of interest in usual pleasures, Feeling worthless/self pity, Feeling angry/irritable Substance abuse history and/or treatment for substance abuse?: Yes  Risk to Others within the past 6 months Homicidal Ideation: No Thoughts of Harm to Others: No Current Homicidal Intent: No Current Homicidal Plan: No Access to Homicidal Means:  No Identified Victim: None History of harm to others?: No Assessment of Violence: None Noted Violent Behavior Description: None Does patient have access to weapons?: No Criminal Charges Pending?: No Does patient have a court date: No  Psychosis Hallucinations: None noted Delusions: None noted  Mental Status Report Appearance/Hygiene: In hospital gown Eye Contact: Poor Motor Activity: Freedom of movement Speech: Logical/coherent Level of Consciousness: Quiet/awake Mood: Depressed Affect: Depressed Anxiety Level: None Thought Processes: Coherent, Relevant Judgement: Impaired Orientation: Person, Place, Time Obsessive Compulsive Thoughts/Behaviors: None  Cognitive Functioning Concentration: Decreased Memory: Recent Intact, Remote Intact IQ: Average Insight: Poor Impulse Control: Poor Appetite: Fair Weight Loss: 0 Weight Gain: 0 Sleep: Decreased Total Hours of Sleep:  (Pt reports she sleeps for a "couple of hours") Vegetative Symptoms: None  ADLScreening [facility] Assessment Services) Patient's cognitive ability adequate to safely complete daily activities?: Yes Patient able to express need for assistance with ADLs?: Yes Independently performs ADLs?: Yes (appropriate for developmental age)  Prior Inpatient Therapy Prior Inpatient Therapy: Yes Prior Therapy Dates: Unknown Prior Therapy Facilty/Provider(s): [facility] and [facility] Reason for Treatment: Depression  Prior Outpatient Therapy Prior Outpatient Therapy: Yes Prior Therapy Dates: Current Prior Therapy Facilty/Provider(s): Dr. Reddy-[facility]  Reason for Treatment: Med Management  ADL Screening (condition at time of admission) Patient's cognitive ability adequate to safely complete daily activities?: Yes Is the patient deaf or have difficulty hearing?: No Does the patient have difficulty seeing, even when wearing glasses/contacts?: No Does the patient have difficulty concentrating, remembering, or making  decisions?: No Patient able to express need for assistance with  ADLs?: Yes Does the patient have difficulty dressing or bathing?: No Independently performs ADLs?: Yes (appropriate for developmental age) Does the patient have difficulty walking or climbing stairs?: No Weakness of Legs: None Weakness of Arms/Hands: None  Home Assistive Devices/Equipment Home Assistive Devices/Equipment: None  Therapy Consults (therapy consults require a physician order) PT Evaluation Needed: No OT Evalulation Needed: No SLP Evaluation Needed: No Abuse/Neglect Assessment (Assessment to be complete while patient is alone) Physical Abuse: Denies Verbal Abuse: Denies Sexual Abuse: Denies Exploitation of patient/patient's resources: Denies Self-Neglect: Denies Values / Beliefs Cultural Requests During Hospitalization: None Spiritual Requests During Hospitalization: None Consults Spiritual Care Consult Needed: No Social Work Consult Needed: No Merchant navy officer (For Healthcare) Does patient have an advance directive?: No Nutrition Screen- [facility] Adult/WL/AP Patient's home diet: Regular Has the patient recently lost weight without trying?: No Has the patient been eating poorly because of a decreased appetite?: No Malnutrition Screening Tool Score: 0  Additional Information 1:1 In Past 12 Months?: No CIRT Risk: No Elopement Risk: No Does patient have medical clearance?: No     Disposition:  Disposition Initial Assessment Completed for this Encounter: Yes Disposition of Patient: Inpatient treatment program Type of inpatient treatment program: Adult  Haskel Khan 07/16/2014 10:12 AM

## 2014-07-16 NOTE — Discharge Summary (Signed)
Physician Discharge Summary  Amy BryantMisty Cummings RUE:[id number] DOB: April 27, 1975 DOA: 07/15/2014  PCP: No PCP Per Patient  Admit date: 07/15/2014 Discharge date: 07/16/2014  Time spent: 45 minutes  Recommendations for Outpatient Follow-up:  -Will be [facility] for continued inpatient psychiatric care.   Discharge Diagnoses:  Active Problems:   Drug overdose   Acute encephalopathy   Hypokalemia   Discharge Condition: Stable and improved  Filed Weights   07/15/14 0126 07/15/14 0451 07/16/14 0708  Weight: 68.04 kg (150 lb) 70 kg (154 lb 5.2 oz) 71.2 kg (156 lb 15.5 oz)    History of present illness:  39 year old female who  has a past medical history of Bipolar 1 disorder; Panic attack; and Anxiety. today was brought to the ED by ambulance after patient took overdose of trazodone and opioids, as well as consuming alcohol. Patient is currently somnolent and arousable but unable to provide any significant history. Patient was given naloxone by the EMS, which improved her mental status. Patient was started on naloxone infusion by the ED physician as patient continued to be lethargic. As per the nursing notes, patient's mother states that patient cut her wrist 2 days ago. There is suicidal ideation with nightmares. Dr. Preston FleetingGlick reviewed [facility] controlled substance reporting website, which showed last now or depression the patient was filled on 22nd for 10 oxycodone acetaminophen, also getting monthly prescriptions for 121 is from 1 mg tablet with last prescription filled on March 28. There is no family present at bedside So history was obtained from the ED notes.  Hospital Course:   Drug overdose -Admits to taking excess of trazodone and Percocet. -Last weekend she also cut her left arm. -Seen in consultation by psychiatry who was requesting an inpatient admission. -Is medically ready for transfer to psychiatric facility.  Acute encephalopathy -Secondary to narcotic and  trazodone overdose. -Resolved. -Off Narcan drip.  Hypokalemia -Repleted.  Hypotension -Resolved with IV fluids.  Procedures:  None   Consultations:  Psychiatry  Discharge Instructions     Medication List    STOP taking these medications        acetaminophen-codeine 300-30 MG per tablet  Commonly known as:  TYLENOL #3     diclofenac 75 MG EC tablet  Commonly known as:  VOLTAREN       Allergies  Allergen Reactions  . Geodon [Ziprasidone Hydrochloride] Other (See Comments)    Patient states that she swallows tongue       Follow-up Information    [facility]   Contact information:   [address] [place] [address] [phone]        The results of significant diagnostics from this hospitalization (including imaging, microbiology, ancillary and laboratory) are listed below for reference.    Significant Diagnostic Studies: No results found.  Microbiology: Recent Results (from the past 240 hour(s))  MRSA PCR Screening     Status: Abnormal   Collection Time: 07/15/14  4:45 AM  Result Value Ref Range Status   MRSA by PCR POSITIVE (A) NEGATIVE Final    Comment:        The GeneXpert MRSA Assay (FDA approved for NASAL specimens only), is one component of a comprehensive MRSA colonization surveillance program. It is not intended to diagnose MRSA infection nor to guide or monitor treatment for MRSA infections. RESULT CALLED TO, READ BACK BY AND VERIFIED WITH: S.HEATH AT 0855 ON 07/15/14 BY S.VANHOORNE      Labs: Basic Metabolic  Panel:  Recent Labs Lab 07/15/14 0120  NA 138  K 3.0*  CL 103  CO2 23  GLUCOSE 96  BUN 5*  CREATININE 0.71  CALCIUM 8.7   Liver Function Tests:  Recent Labs Lab 07/15/14 0120  AST 31  ALT 31  ALKPHOS 64  BILITOT 0.4  PROT 7.4  ALBUMIN 3.8   No results for input(s): LIPASE, AMYLASE in the last 168 hours. No results for input(s): AMMONIA in the last 168  hours. CBC:  Recent Labs Lab 07/15/14 0120  WBC 9.0  NEUTROABS 5.5  HGB 15.7*  HCT 44.0  MCV 92.8  PLT 274   Cardiac Enzymes: No results for input(s): CKTOTAL, CKMB, CKMBINDEX, TROPONINI in the last 168 hours. BNP: BNP (last 3 results) No results for input(s): BNP in the last 8760 hours.  ProBNP (last 3 results) No results for input(s): PROBNP in the last 8760 hours.  CBG: No results for input(s): GLUCAP in the last 168 hours.     SignedChaya Jan  [facility] Pager: [phone] 07/16/2014, 1:08 PM

## 2014-07-16 NOTE — Progress Notes (Signed)
[facility] PROGRESS NOTE  Amy BryantMisty Cummings UJW:[id number]:[id number] DOB: May 02, 1975 DOA: 07/15/2014 PCP: No PCP Per Patient  Assessment/Plan: Drug overdose -Admits to taking excess of trazodone and Percocet. -Last weekend she also cut her left arm. -We'll request psychiatry consultation as suspect she will need inpatient psychiatric management. -Is medically ready for transfer to psychiatric facility if deemed necessary by psychiatry consultation.  Acute encephalopathy -Secondary to narcotic and trazodone overdose. -Resolved. -Off Narcan drip.  Hypokalemia -Repleted. -Recheck potassium level in the morning.  Hypotension -Resolved with IV fluids. -We'll DC fluids.  Code Status: Full code Family Communication: Patient only  Disposition Plan: Transfer to floor pending psychiatric evaluation.   Consultants:  Psychiatry   Antibiotics:  None   Subjective: Alert today.  Objective: Filed Vitals:   07/16/14 0600 07/16/14 0708 07/16/14 0800 07/16/14 0900  BP: 123/68   133/74  Pulse: 64  64 75  Temp:  97.5 F (36.4 C) 98.6 F (37 C)   TempSrc:  Oral    Resp: 15  18 17   Height:      Weight:  71.2 kg (156 lb 15.5 oz)    SpO2: 95%  96% 96%    Intake/Output Summary (Last 24 hours) at 07/16/14 0912 Last data filed at 07/16/14 0800  Gross per 24 hour  Intake 4117.96 ml  Output   3250 ml  Net 867.96 ml   Filed Weights   07/15/14 0126 07/15/14 0451 07/16/14 0708  Weight: 68.04 kg (150 lb) 70 kg (154 lb 5.2 oz) 71.2 kg (156 lb 15.5 oz)    Exam:   General:  Awake, alert, oriented 3  Cardiovascular: Regular rate and rhythm, no murmurs, rubs or gallops.  Respiratory: Clear to auscultation bilaterally  Abdomen: Soft, nontender, nondistended, positive bowel sounds, no masses or organomegaly noted  Extremities: No clubbing, cyanosis or edema, positive pedal pulses   Neurologic:  Grossly intact and nonfocal.  Data Reviewed: Basic Metabolic Panel:  Recent  Labs Lab 07/15/14 0120  NA 138  K 3.0*  CL 103  CO2 23  GLUCOSE 96  BUN 5*  CREATININE 0.71  CALCIUM 8.7   Liver Function Tests:  Recent Labs Lab 07/15/14 0120  AST 31  ALT 31  ALKPHOS 64  BILITOT 0.4  PROT 7.4  ALBUMIN 3.8   No results for input(s): LIPASE, AMYLASE in the last 168 hours. No results for input(s): AMMONIA in the last 168 hours. CBC:  Recent Labs Lab 07/15/14 0120  WBC 9.0  NEUTROABS 5.5  HGB 15.7*  HCT 44.0  MCV 92.8  PLT 274   Cardiac Enzymes: No results for input(s): CKTOTAL, CKMB, CKMBINDEX, TROPONINI in the last 168 hours. BNP (last 3 results) No results for input(s): BNP in the last 8760 hours.  ProBNP (last 3 results) No results for input(s): PROBNP in the last 8760 hours.  CBG: No results for input(s): GLUCAP in the last 168 hours.  Recent Results (from the past 240 hour(s))  MRSA PCR Screening     Status: Abnormal   Collection Time: 07/15/14  4:45 AM  Result Value Ref Range Status   MRSA by PCR POSITIVE (A) NEGATIVE Final    Comment:        The GeneXpert MRSA Assay (FDA approved for NASAL specimens only), is one component of a comprehensive MRSA colonization surveillance program. It is not intended to diagnose MRSA infection nor to guide or monitor treatment for MRSA infections. RESULT CALLED TO, READ BACK BY AND VERIFIED WITH: S.HEATH AT  2956 ON 07/15/14 BY S.VANHOORNE      Studies: No results found.  Scheduled Meds: . Chlorhexidine Gluconate Cloth  6 each Topical Q0600  . enoxaparin (LOVENOX) injection  40 mg Subcutaneous Q24H  . mupirocin ointment  1 application Nasal BID  . sodium chloride  3 mL Intravenous Q12H   Continuous Infusions: . sodium chloride 1,000 mL (07/16/14 0800)    Active Problems:   Drug overdose   Acute encephalopathy   Hypokalemia    Time spent: 35 minutes. Greater than 50% of this time was spent in direct contact with the patient coordinating care.    Chaya Jan  [facility] Pager [phone]  If 7PM-7AM, please contact night-coverage at [email or web address], password [facility] 07/16/2014, 9:12 AM  LOS: 1 day

## 2014-07-16 NOTE — Progress Notes (Signed)
Pt accepted to the services of Dr. Dub MikesLugo and is assigned to Bed 303-2.  RN notified  Janann ColonelGregory Pickett Jr. MSW, LCSW Therapeutic Triage Services-Triage Specialist   Phone: [phone] Fax: [phone]

## 2014-07-16 NOTE — Tx Team (Addendum)
Initial Interdisciplinary Treatment Plan   PATIENT STRESSORS: Financial difficulties Marital or family conflict Substance abuse   PATIENT STRENGTHS: Ability for insight Wellsite geologistCommunication skills General fund of knowledge Motivation for treatment/growth   PROBLEM LIST: Problem List/Patient Goals Date to be addressed Date deferred Reason deferred Estimated date of resolution  "I need to learn how to say no to drugs" 07/16/14     "Not to get so upset" 07/16/14     "I need to learn life coping skills and learn how to deal with stress and anger and depression" 07/16/14                                          DISCHARGE CRITERIA:  Improved stabilization in mood, thinking, and/or behavior Motivation to continue treatment in a less acute level of care Need for constant or close observation no longer present  PRELIMINARY DISCHARGE PLAN: Attend 12-step recovery group Outpatient therapy  PATIENT/FAMIILY INVOLVEMENT: This treatment plan has been presented to and reviewed with the patient, Amy Cummings.  The patient and family have been given the opportunity to ask questions and make suggestions.  Amy Cummings, Amy Cummings 07/16/2014, 10:36 PM

## 2014-07-16 NOTE — Progress Notes (Signed)
Psychoeducational Group Note  Date: 07/16/2014 Time:  1015  Group Topic/Focus:  Identifying Needs:   The focus of this group is to help patients identify their personal needs that have been historically problematic and identify healthy behaviors to address their needs.  Participation Level:  Active  Participation Quality:  Appropriate  Affect:  Appropriate  Cognitive:  Oriented  Insight:  Improving  Engagement in Group:  Engaged  Additional Comments:  Pt attended this group and participated   Aarush Stukey A 

## 2014-07-16 NOTE — Progress Notes (Addendum)
Amy Cummings is a 39 yo caucasian female who is [facility] after drinking a large amount of alcohol the previous day ( Friday 07/15/2014) and then taking a " handful of my pills " ), which she says were latuda, trazadone, xanax and lamictal. She says she is on disability " for my bipolar" and she sees Dr. Betti Cruzeddy outpatient and he prescribes these meds for her. She says " I have a bad drug problem " and then reiterates " I do all of them...cocaine, heroin, alcohol, pain pills and crack. If I do crack, I have to drink to bring me down". She cannot remember how old she was when she was diagnosed with bipolar nor can she remember when her last period of sobriety was. She does think it lasted about 2 weeks and says she has not been able to abstain from drugging any " longer than that".   She denies a history of abuse and says she has a 339 yr old son and that she does have a boyfriend " he's the love of my life". SHe says her goal this hospitalization is to " just get off these drugs and get back home". 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 She says she has had multiple [facility] health [facility] " [facility] ". She says her PMH is sign for " Hep C" , says she received a flu vaccine in Oct 2015 and denies  the request for a pneumovax. She says she smokes 1-1 1/2 packs cigarettes / day and that she wants a nicoderm [patch. She contracts for safety with thi nurse and says " I'm not suicidal. I never would've done that if I hadn't been drinking..." After her papaerwork is completed she is oriented to the unit and left to in her room to acclamate to the unit.

## 2014-07-16 NOTE — Progress Notes (Signed)
Spoke with Dr. Ardyth HarpsHernandez who states that patient is medically cleared at this time.   Amy ColonelGregory Pickett Jr. MSW, LCSW Therapeutic Triage Services-Triage Specialist   Phone: [phone] Fax: [phone]

## 2014-07-16 NOTE — Progress Notes (Signed)
Psychoeducational Group Note  Date:  07/16/2014 Time:  2100 Group Topic/Focus:  wrap up group  Participation Level: Did Not Attend  Participation Quality:  Not Applicable  Affect:  Not Applicable  Cognitive:  Not Applicable  Insight:  Not Applicable  Engagement in Group: Not Applicable  Additional Comments: Pt was newly admitted to the unit and was lying in the bed talking to the nurse when group began. Pt wasn't notified that group was beginning.   Shelah LewandowskySquires, Zayin Valadez Carol 07/16/2014, 10:56 PM

## 2014-07-16 NOTE — Progress Notes (Signed)
Counselor consulted with Renata Capriceonrad, NP who states that patient meets inpatient criteria following medical clearance.  Janann ColonelGregory Pickett Jr. MSW, LCSW Therapeutic Triage Services-Triage Specialist   Phone: [phone] Fax: [phone]

## 2014-07-16 NOTE — Progress Notes (Signed)
Counselor attempted to initiate TTS consult however there is no answer after 2 calls via tele psychiatry equipment. Counselor spoke with Lyman BishopLawrence at ICU station who requested for counselor to try again in 10 minutes when RN returns.

## 2014-07-17 DIAGNOSIS — E876 Hypokalemia: Secondary | ICD-10-CM

## 2014-07-17 DIAGNOSIS — F1020 Alcohol dependence, uncomplicated: Secondary | ICD-10-CM | POA: Diagnosis present

## 2014-07-17 DIAGNOSIS — T43212A Poisoning by selective serotonin and norepinephrine reuptake inhibitors, intentional self-harm, initial encounter: Secondary | ICD-10-CM

## 2014-07-17 DIAGNOSIS — G9340 Encephalopathy, unspecified: Secondary | ICD-10-CM

## 2014-07-17 DIAGNOSIS — F1994 Other psychoactive substance use, unspecified with psychoactive substance-induced mood disorder: Secondary | ICD-10-CM | POA: Diagnosis present

## 2014-07-17 DIAGNOSIS — T1491 Suicide attempt: Secondary | ICD-10-CM

## 2014-07-17 DIAGNOSIS — F314 Bipolar disorder, current episode depressed, severe, without psychotic features: Secondary | ICD-10-CM

## 2014-07-17 MED ORDER — CLONIDINE HCL 0.1 MG PO TABS
0.1000 mg | ORAL_TABLET | ORAL | Status: DC
  Administered 2014-07-19: 0.1 mg via ORAL
  Filled 2014-07-17 (×4): qty 1

## 2014-07-17 MED ORDER — CLONIDINE HCL 0.1 MG PO TABS
0.1000 mg | ORAL_TABLET | Freq: Every day | ORAL | Status: DC
  Filled 2014-07-17: qty 1

## 2014-07-17 MED ORDER — CLONIDINE HCL 0.1 MG PO TABS
0.1000 mg | ORAL_TABLET | Freq: Four times a day (QID) | ORAL | Status: AC
  Administered 2014-07-17 – 2014-07-18 (×7): 0.1 mg via ORAL
  Filled 2014-07-17 (×9): qty 1

## 2014-07-17 MED ORDER — NAPROXEN 500 MG PO TABS: 500.0000 mg | ORAL_TABLET | Freq: Two times a day (BID) | ORAL | Status: DC | PRN

## 2014-07-17 MED ORDER — METHOCARBAMOL 500 MG PO TABS: 500.0000 mg | ORAL_TABLET | Freq: Three times a day (TID) | ORAL | Status: DC | PRN

## 2014-07-17 MED ORDER — QUETIAPINE FUMARATE 100 MG PO TABS
100.0000 mg | ORAL_TABLET | Freq: Every day | ORAL | Status: DC
Start: 2014-07-17 — End: 2014-07-19
  Administered 2014-07-17 – 2014-07-18 (×2): 100 mg via ORAL
  Filled 2014-07-17 (×3): qty 1
  Filled 2014-07-17: qty 3

## 2014-07-17 MED ORDER — DICYCLOMINE HCL 20 MG PO TABS
20.0000 mg | ORAL_TABLET | Freq: Four times a day (QID) | ORAL | Status: DC | PRN
  Administered 2014-07-19: 20 mg via ORAL
  Filled 2014-07-17: qty 1

## 2014-07-17 MED ORDER — PRAZOSIN HCL 1 MG PO CAPS
1.0000 mg | ORAL_CAPSULE | Freq: Every day | ORAL | Status: DC
  Administered 2014-07-17 – 2014-07-18 (×2): 1 mg via ORAL
  Filled 2014-07-17 (×2): qty 1
  Filled 2014-07-17: qty 3
  Filled 2014-07-17: qty 1

## 2014-07-17 MED ORDER — SILVER SULFADIAZINE 1 % EX CREA
TOPICAL_CREAM | Freq: Every day | CUTANEOUS | Status: DC
  Administered 2014-07-17 – 2014-07-18 (×2): via TOPICAL
  Administered 2014-07-19: 1 via TOPICAL
  Filled 2014-07-17: qty 85

## 2014-07-17 MED ORDER — LISINOPRIL 10 MG PO TABS: 10.0000 mg | ORAL_TABLET | Freq: Every day | ORAL | Status: DC

## 2014-07-17 MED ORDER — LISINOPRIL 20 MG PO TABS
20.0000 mg | ORAL_TABLET | Freq: Once | ORAL | Status: DC
  Filled 2014-07-17: qty 1

## 2014-07-17 NOTE — Progress Notes (Signed)
Patient ID: Cornelious BryantMisty Cummings, female   DOB: [birth year], 39 y.o.   MRN: [id number]   Pt signed a 72 hour request for discharge 07/17/14 at 1:18pm, Aggie NP was made aware. Jacquelyne BalintShalita Kirsi Hugh RN

## 2014-07-17 NOTE — H&P (Signed)
Psychiatric Admission Assessment Adult  Patient Identification: Amy Cummings MRN:  [id number] Date of Evaluation:  07/17/2014 Chief Complaint:  Wendall Mola Principal Diagnosis: <principal problem not specified> Diagnosis:   Patient Active Problem List   Diagnosis Date Noted  . Bipolar disorder current episode depressed [F31.30] 07/16/2014  . Drug overdose [T50.901A] 07/15/2014  . Acute encephalopathy [G93.40] 07/15/2014  . Hypokalemia [E87.6]    History of Present Illness: Amy Cummings is a 39 year old Caucasian female. Admitted to [facility] from the [facility] with complaints of suicide attempts by overdose on Trazodone tablets. She reports, "I called the ambulance on Thursday night. I was drinking a lot, shooting cocaine, popping pills & smoking weeds all day that day. All of a sudden, it hits me. I asked myself; what are you doing to yourself?. I said, I'm sick of this drug use. This got to stop. I have been using drugs since age 23. My drug use worsened since January of this year after I had surgery & was prescribed opioid for my pains. The use of the pain pills triggered my addiction at it's worse. While trying to undope myself, I keep sinking deeper & deeper into addiction. I'm now ready to kick this habit because I have a lot to lose. I will be starting treatment with [facility] in [place] after my discharge from here. I have bad nightmares from being tired up by drug dealers, so I use drugs to knock myself out at night so I don't remember my nightmares. I also learned to cut on my wrists about 20 years ago to cope & to get attention, never to kill myself. I was diagnosed with Bipolar disorder 22 years ago. I was seeing Dr. Jeanell Sparrow who put me on Latuda & Lamictal. Anette Guarneri is not helping me. I was never suicidal. I took the Trazodone to act like I was suicidal just to get into hospital for drug detox".  Elements:  Location:  Polysubstance dependence. Quality:  Increased drug use, agitation,  depressed mood, insomnia. Severity:  Severe, "I was using drinking a lot, shooting cocaine, popping pills & smoking weed". Timing:  "My drug use worsened since January of this year (2016)". Duration:  Chronic, "been using since age 39". Context:  "I was using a lot of drugs/alcohol on Thursday night, all of a sudden, it hits me, in order to get into the hospital, I took about 7 tablets of Trazodone & called 911"..  Associated Signs/Symptoms:  Depression Symptoms:  depressed mood, insomnia, feelings of worthlessness/guilt, hopelessness, anxiety,  (Hypo) Manic Symptoms:  Impulsivity, Irritable Mood, Labiality of Mood,  Anxiety Symptoms:  Excessive Worry, Panic Symptoms,  Psychotic Symptoms:  Patient denies  PTSD Symptoms: Re-experiencing:  Flashbacks Nightmares  Total Time spent with patient: 1 hour  Past Medical History:  Past Medical History  Diagnosis Date  . Bipolar 1 disorder   . Panic attack   . Anxiety     Past Surgical History  Procedure Laterality Date  . Tubal ligation     Family History: History reviewed. No pertinent family history.  Social History:  History  Alcohol Use  . Yes    Comment: Patient reports drinking alcohol daily      History  Drug Use  . Yes  . Special: "Crack" cocaine, Marijuana    History   Social History  . Marital Status: Single    Spouse Name: N/A  . Number of Children: N/A  . Years of Education: N/A   Social History Main Topics  .  Smoking status: Current Every Day Smoker -- 1.00 packs/day for 20 years    Types: Cigarettes  . Smokeless tobacco: Never Used  . Alcohol Use: Yes     Comment: Patient reports drinking alcohol daily   . Drug Use: Yes    Special: "Crack" cocaine, Marijuana  . Sexual Activity: Yes    Birth Control/ Protection: Surgical     Comment: s/p tubal ligation   Other Topics Concern  . None   Social History Narrative   Additional Social History:    Pain Medications: " any off the  street" Negative Consequences of Use: Legal, Personal relationships Withdrawal Symptoms: Agitation, Irritability, Nausea / Vomiting, Sweats, Tachycardia, Tingling, Patient aware of relationship between substance abuse and physical/medical complications Name of Substance 1: crack cocaine 1 - Age of First Use: 39yo 1 - Last Use / Amount: 07/14/14 Name of Substance 2: alcohol 2 - Age of First Use: 39yo 2 - Frequency: AMAP 2 - Last Use / Amount: 07/14/2014 Name of Substance 3: pain pills 3 - Age of First Use: 39yo  Musculoskeletal: Strength & Muscle Tone: within normal limits Gait & Station: normal Patient leans: N/A  Psychiatric Specialty Exam: Physical Exam  Constitutional: She is oriented to person, place, and time. She appears well-developed.  HENT:  Head: Normocephalic.  Eyes: Pupils are equal, round, and reactive to light.  Neck: Normal range of motion.  Cardiovascular: Normal rate.   Respiratory: Effort normal.  GI: Soft.  Genitourinary:  Denies any issues in this area  Musculoskeletal: Normal range of motion.  Neurological: She is alert and oriented to person, place, and time.  Skin: Skin is warm and dry.  Burn lesions to under right breast, tx in progress.  Psychiatric: Thought content normal. Her mood appears anxious. Her affect is not angry, not blunt, not labile and not inappropriate. Her speech is rapid and/or pressured. She is agitated and hyperactive. She is not aggressive, not slowed, not withdrawn, not actively hallucinating and not combative. Cognition and memory are normal. She expresses impulsivity. She exhibits a depressed mood. She is attentive.    Review of Systems  Constitutional: Positive for chills, malaise/fatigue and diaphoresis.  Eyes: Positive for blurred vision.  Respiratory: Negative.   Cardiovascular: Negative.   Gastrointestinal: Positive for nausea and abdominal pain.  Genitourinary: Negative.   Musculoskeletal: Positive for myalgias.  Skin:  Negative.  Negative for itching and rash.       Burn lesions to under right beast; tx in progress.  Neurological: Positive for dizziness, tremors and headaches.  Endo/Heme/Allergies: Negative.   Psychiatric/Behavioral: Positive for depression and substance abuse (Polysubstance dependence). Negative for suicidal ideas, hallucinations and memory loss. The patient is nervous/anxious and has insomnia.     Blood pressure 144/80, pulse 84, temperature 98.7 F (37.1 C), temperature source Oral, resp. rate 18, height '5\' 1"'  (1.549 m), weight 72.405 kg (159 lb 10 oz), SpO2 100 %.Body mass index is 30.18 kg/(m^2).  General Appearance: Disheveled  Eye Contact::  Good  Speech:  Clear and Coherent and Pressured  Volume:  Increased  Mood:  Anxious and Depressed  Affect:  Labile  Thought Process:  Coherent and Intact  Orientation:  Full (Time, Place, and Person)  Thought Content:  Rumination and denies any hallucinations, delusions or paranoia  Suicidal Thoughts:  Patient denies  Homicidal Thoughts:  Patient denies  Memory:  Grossly intact  Judgement:  Fair  Insight:  Present  Psychomotor Activity:  Increased, Restlessness and Tremor  Concentration:  Fair  Recall:  Roel Cluck of Knowledge:Fair  Language: Good  Akathisia:  No  Handed:  Right  AIMS (if indicated):     Assets:  Communication Skills Desire for Improvement  ADL's:  Impaired  Cognition: WNL  Sleep:  Number of Hours: 4.5   Risk to Self: Is patient at risk for suicide?: No What has been your use of drugs/alcohol within the last 12 months?: Alcohol, marijuana, crack cocaine, shooting up cocaine, shooting heroin  Risk to Others: No  Prior Inpatient Therapy: No  Prior Outpatient Therapy: No  Alcohol Screening: Patient refused Alcohol Screening Tool: Yes 1. How often do you have a drink containing alcohol?: 4 or more times a week 2. How many drinks containing alcohol do you have on a typical day when you are drinking?: 10 or  more 3. How often do you have six or more drinks on one occasion?: Daily or almost daily Preliminary Score: 8 4. How often during the last year have you found that you were not able to stop drinking once you had started?: Daily or almost daily 5. How often during the last year have you failed to do what was normally expected from you becasue of drinking?: Daily or almost daily 6. How often during the last year have you needed a first drink in the morning to get yourself going after a heavy drinking session?: Monthly 7. How often during the last year have you had a feeling of guilt of remorse after drinking?: Weekly 8. How often during the last year have you been unable to remember what happened the night before because you had been drinking?: Less than monthly 9. Have you or someone else been injured as a result of your drinking?: No 10. Has a relative or friend or a doctor or another health worker been concerned about your drinking or suggested you cut down?: Yes, but not in the last year Alcohol Use Disorder Identification Test Final Score (AUDIT): 28 Brief Intervention: Patient declined brief intervention  Allergies:   Allergies  Allergen Reactions  . Geodon [Ziprasidone Hydrochloride] Other (See Comments)    Patient states that she swallows tongue   Lab Results:  Results for orders placed or performed during the hospital encounter of 07/15/14 (from the past 48 hour(s))  Acetaminophen level     Status: Abnormal   Collection Time: 07/16/14 10:57 AM  Result Value Ref Range   Acetaminophen (Tylenol), Serum <10.0 (L) 10 - 30 ug/mL    Comment:        THERAPEUTIC CONCENTRATIONS VARY SIGNIFICANTLY. A RANGE OF 10-30 ug/mL MAY BE AN EFFECTIVE CONCENTRATION FOR MANY PATIENTS. HOWEVER, SOME ARE BEST TREATED AT CONCENTRATIONS OUTSIDE THIS RANGE. ACETAMINOPHEN CONCENTRATIONS >150 ug/mL AT 4 HOURS AFTER INGESTION AND >50 ug/mL AT 12 HOURS AFTER INGESTION ARE OFTEN ASSOCIATED WITH  TOXIC REACTIONS.    Current Medications: Current Facility-Administered Medications  Medication Dose Route Frequency Provider Last Rate Last Dose  . chlordiazePOXIDE (LIBRIUM) capsule 25 mg  25 mg Oral Q6H PRN Dara Hoyer, PA-C      . chlordiazePOXIDE (LIBRIUM) capsule 25 mg  25 mg Oral QID Dara Hoyer, PA-C   25 mg at 07/17/14 0813   Followed by  . [START ON 07/18/2014] chlordiazePOXIDE (LIBRIUM) capsule 25 mg  25 mg Oral TID Dara Hoyer, PA-C       Followed by  . [START ON 07/19/2014] chlordiazePOXIDE (LIBRIUM) capsule 25 mg  25 mg Oral BH-qamhs Dara Hoyer, PA-C  Followed by  . [START ON 07/21/2014] chlordiazePOXIDE (LIBRIUM) capsule 25 mg  25 mg Oral Daily Dara Hoyer, PA-C      . cloNIDine (CATAPRES) tablet 0.1 mg  0.1 mg Oral QID Dara Hoyer, PA-C   0.1 mg at 07/17/14 0813   Followed by  . [START ON 07/19/2014] cloNIDine (CATAPRES) tablet 0.1 mg  0.1 mg Oral BH-qamhs Dara Hoyer, PA-C       Followed by  . [START ON 07/21/2014] cloNIDine (CATAPRES) tablet 0.1 mg  0.1 mg Oral QAC breakfast Dara Hoyer, PA-C      . dicyclomine (BENTYL) tablet 20 mg  20 mg Oral Q6H PRN Dara Hoyer, PA-C      . hydrOXYzine (ATARAX/VISTARIL) tablet 25 mg  25 mg Oral Q6H PRN Dara Hoyer, PA-C   25 mg at 07/17/14 0556  . [START ON 07/18/2014] lisinopril (PRINIVIL,ZESTRIL) tablet 10 mg  10 mg Oral Daily Encarnacion Slates, NP      . lisinopril (PRINIVIL,ZESTRIL) tablet 20 mg  20 mg Oral Once Encarnacion Slates, NP      . loperamide (IMODIUM) capsule 2-4 mg  2-4 mg Oral PRN Dara Hoyer, PA-C      . methocarbamol (ROBAXIN) tablet 500 mg  500 mg Oral Q8H PRN Dara Hoyer, PA-C      . multivitamin with minerals tablet 1 tablet  1 tablet Oral Daily Dara Hoyer, PA-C   1 tablet at 07/17/14 0813  . naproxen (NAPROSYN) tablet 500 mg  500 mg Oral BID PRN Dara Hoyer, PA-C      . nicotine (NICODERM CQ - dosed in mg/24 hours) patch 21 mg  21 mg Transdermal Daily Merian Capron,  MD   21 mg at 07/17/14 0867  . ondansetron (ZOFRAN-ODT) disintegrating tablet 4 mg  4 mg Oral Q6H PRN Dara Hoyer, PA-C      . silver sulfADIAZINE (SILVADENE) 1 % cream   Topical Daily Dara Hoyer, PA-C      . thiamine (VITAMIN B-1) tablet 100 mg  100 mg Oral Daily Dara Hoyer, PA-C   100 mg at 07/17/14 0813  . traZODone (DESYREL) tablet 50 mg  50 mg Oral QHS PRN Merian Capron, MD   50 mg at 07/16/14 2150   PTA Medications: No prescriptions prior to admission   Previous Psychotropic Medications: Yes   Substance Abuse History in the last 12 months:  Yes.    Consequences of Substance Abuse: Medical Consequences:  Liver damage, Possible death by overdose Legal Consequences:  Arrests, jail time, Loss of driving privilege. Family Consequences:  Family discord, divorce and or separation.  Results for orders placed or performed during the hospital encounter of 07/15/14 (from the past 72 hour(s))  Acetaminophen level     Status: Abnormal   Collection Time: 07/15/14  1:20 AM  Result Value Ref Range   Acetaminophen (Tylenol), Serum <10.0 (L) 10 - 30 ug/mL    Comment:        THERAPEUTIC CONCENTRATIONS VARY SIGNIFICANTLY. A RANGE OF 10-30 ug/mL MAY BE AN EFFECTIVE CONCENTRATION FOR MANY PATIENTS. HOWEVER, SOME ARE BEST TREATED AT CONCENTRATIONS OUTSIDE THIS RANGE. ACETAMINOPHEN CONCENTRATIONS >150 ug/mL AT 4 HOURS AFTER INGESTION AND >50 ug/mL AT 12 HOURS AFTER INGESTION ARE OFTEN ASSOCIATED WITH TOXIC REACTIONS.   Comprehensive metabolic panel     Status: Abnormal   Collection Time: 07/15/14  1:20 AM  Result Value Ref Range   Sodium 138 135 - 145  mmol/L   Potassium 3.0 (L) 3.5 - 5.1 mmol/L   Chloride 103 96 - 112 mmol/L   CO2 23 19 - 32 mmol/L   Glucose, Bld 96 70 - 99 mg/dL   BUN 5 (L) 6 - 23 mg/dL   Creatinine, Ser 0.71 0.50 - 1.10 mg/dL   Calcium 8.7 8.4 - 10.5 mg/dL   Total Protein 7.4 6.0 - 8.3 g/dL   Albumin 3.8 3.5 - 5.2 g/dL   AST 31 0 - 37 U/L   ALT 31 0  - 35 U/L   Alkaline Phosphatase 64 39 - 117 U/L   Total Bilirubin 0.4 0.3 - 1.2 mg/dL   GFR calc non Af Amer >90 >90 mL/min   GFR calc Af Amer >90 >90 mL/min    Comment: (NOTE) The eGFR has been calculated using the CKD EPI equation. This calculation has not been validated in all clinical situations. eGFR's persistently <90 mL/min signify possible Chronic Kidney Disease.    Anion gap 12 5 - 15  Ethanol     Status: Abnormal   Collection Time: 07/15/14  1:20 AM  Result Value Ref Range   Alcohol, Ethyl (B) 141 (H) 0 - 9 mg/dL    Comment:        LOWEST DETECTABLE LIMIT FOR SERUM ALCOHOL IS 11 mg/dL FOR MEDICAL PURPOSES ONLY   Salicylate level     Status: None   Collection Time: 07/15/14  1:20 AM  Result Value Ref Range   Salicylate Lvl <7.5 2.8 - 20.0 mg/dL  CBC with Differential     Status: Abnormal   Collection Time: 07/15/14  1:20 AM  Result Value Ref Range   WBC 9.0 4.0 - 10.5 K/uL   RBC 4.74 3.87 - 5.11 MIL/uL   Hemoglobin 15.7 (H) 12.0 - 15.0 g/dL   HCT 44.0 36.0 - 46.0 %   MCV 92.8 78.0 - 100.0 fL   MCH 33.1 26.0 - 34.0 pg   MCHC 35.7 30.0 - 36.0 g/dL   RDW 13.5 11.5 - 15.5 %   Platelets 274 150 - 400 K/uL   Neutrophils Relative % 62 43 - 77 %   Neutro Abs 5.5 1.7 - 7.7 K/uL   Lymphocytes Relative 33 12 - 46 %   Lymphs Abs 3.0 0.7 - 4.0 K/uL   Monocytes Relative 5 3 - 12 %   Monocytes Absolute 0.5 0.1 - 1.0 K/uL   Eosinophils Relative 0 0 - 5 %   Eosinophils Absolute 0.0 0.0 - 0.7 K/uL   Basophils Relative 0 0 - 1 %   Basophils Absolute 0.0 0.0 - 0.1 K/uL  Urinalysis, Routine w reflex microscopic     Status: Abnormal   Collection Time: 07/15/14  3:28 AM  Result Value Ref Range   Color, Urine YELLOW YELLOW   APPearance CLEAR CLEAR   Specific Gravity, Urine <1.005 (L) 1.005 - 1.030   pH 5.5 5.0 - 8.0   Glucose, UA NEGATIVE NEGATIVE mg/dL   Hgb urine dipstick NEGATIVE NEGATIVE   Bilirubin Urine NEGATIVE NEGATIVE   Ketones, ur NEGATIVE NEGATIVE mg/dL    Protein, ur NEGATIVE NEGATIVE mg/dL   Urobilinogen, UA 0.2 0.0 - 1.0 mg/dL   Nitrite NEGATIVE NEGATIVE   Leukocytes, UA NEGATIVE NEGATIVE    Comment: MICROSCOPIC NOT DONE ON URINES WITH NEGATIVE PROTEIN, BLOOD, LEUKOCYTES, NITRITE, OR GLUCOSE <1000 mg/dL.  Urine rapid drug screen (hosp performed)     Status: Abnormal   Collection Time: 07/15/14  3:28 AM  Result  Value Ref Range   Opiates POSITIVE (A) NONE DETECTED   Cocaine POSITIVE (A) NONE DETECTED   Benzodiazepines NONE DETECTED NONE DETECTED   Amphetamines NONE DETECTED NONE DETECTED   Tetrahydrocannabinol NONE DETECTED NONE DETECTED   Barbiturates NONE DETECTED NONE DETECTED    Comment:        DRUG SCREEN FOR MEDICAL PURPOSES ONLY.  IF CONFIRMATION IS NEEDED FOR ANY PURPOSE, NOTIFY LAB WITHIN 5 DAYS.        LOWEST DETECTABLE LIMITS FOR URINE DRUG SCREEN Drug Class       Cutoff (ng/mL) Amphetamine      1000 Barbiturate      200 Benzodiazepine   168 Tricyclics       372 Opiates          300 Cocaine          300 THC              50   POC urine preg, ED     Status: None   Collection Time: 07/15/14  3:31 AM  Result Value Ref Range   Preg Test, Ur NEGATIVE NEGATIVE    Comment:        THE SENSITIVITY OF THIS METHODOLOGY IS >24 mIU/mL   MRSA PCR Screening     Status: Abnormal   Collection Time: 07/15/14  4:45 AM  Result Value Ref Range   MRSA by PCR POSITIVE (A) NEGATIVE    Comment:        The GeneXpert MRSA Assay (FDA approved for NASAL specimens only), is one component of a comprehensive MRSA colonization surveillance program. It is not intended to diagnose MRSA infection nor to guide or monitor treatment for MRSA infections. RESULT CALLED TO, READ BACK BY AND VERIFIED WITH: S.HEATH AT 0855 ON 07/15/14 BY S.VANHOORNE   Acetaminophen level     Status: Abnormal   Collection Time: 07/16/14 10:57 AM  Result Value Ref Range   Acetaminophen (Tylenol), Serum <10.0 (L) 10 - 30 ug/mL    Comment:        THERAPEUTIC  CONCENTRATIONS VARY SIGNIFICANTLY. A RANGE OF 10-30 ug/mL MAY BE AN EFFECTIVE CONCENTRATION FOR MANY PATIENTS. HOWEVER, SOME ARE BEST TREATED AT CONCENTRATIONS OUTSIDE THIS RANGE. ACETAMINOPHEN CONCENTRATIONS >150 ug/mL AT 4 HOURS AFTER INGESTION AND >50 ug/mL AT 12 HOURS AFTER INGESTION ARE OFTEN ASSOCIATED WITH TOXIC REACTIONS.     Observation Level/Precautions:  15 minute checks  Laboratory:  Per ED, BAL 141, (+) Opiates, Cocaine  Psychotherapy: Group sessions  Medications:  See Active medication lists on the [facility]  Consultations: As needed    Discharge Concerns:  Safety, mood stability, sobriety  Estimated LOS: 3-5 days  Other:     Psychological Evaluations: Yes   Treatment Plan Summary: Daily contact with patient to assess and evaluate symptoms and progress in treatment and Medication management:  1. Admit for crisis management and stabilization, estimated length of stay 3-5 days.  2. Medication management to reduce current symptoms to base line and improve the patient's overall level of functioning; continue Librium/clonidine detox protocols for alcohol/opioid detox, discontinue Trazodone due to nightmares, initiate Minipress 1 mg q hs, Seroquel 100 mg Q hs for mood control. 3. Treat health problems as indicated; Sulfadiazine to burn areas to under right breast.  4. Develop treatment plan to decrease risk of relapse upon discharge and the need for readmission.  5. Psycho-social education regarding relapse prevention and self care.  6. Health care follow up as needed for medical problems.  7.  Review, reconcile, and reinstate any pertinent home medications for other health issues where appropriate. 8. Call for consults with hospitalist for any additional specialty patient care services as needed.  Medical Decision Making:  New problem, with additional work up planned, Review of Psycho-Social Stressors (1), Review or order clinical lab tests (1), Discuss test with performing  physician (1), Review and summation of old records (2), Review of Medication Regimen & Side Effects (2) and Review of New Medication or Change in Dosage (2)  I certify that inpatient services furnished can reasonably be expected to improve the patient's condition.   Lindell Spar I, PMHNP-BC 4/10/20169:52 AM  I have examined the patient and agreed with the findings of H&P and treatment plan. I have also done suicide assessment on this patient.

## 2014-07-17 NOTE — Plan of Care (Signed)
Problem: Alteration in mood & ability to function due to Goal: LTG-Pt reports reduction in suicidal thoughts (Patient reports reduction in suicidal thoughts and is able to verbalize a safety plan for whenever patient is feeling suicidal)  Outcome: Progressing Pt denied SI tonight and verbally contracted for safety.   Goal: STG-Patient will comply with prescribed medication regimen (Patient will comply with prescribed medication regimen)  Outcome: Progressing Pt has been compliant with all medications this shift.

## 2014-07-17 NOTE — [facility] Suicide Risk Assessment (Signed)
[facility]   Nursing information obtained from:    Demographic factors:    Current Mental Status:    Loss Factors:    Historical Factors:    Risk Reduction Factors:    Total Time spent with patient: 1.5 hours Principal Problem: <principal problem not specified> Diagnosis:   Patient Active Problem List   Diagnosis Date Noted  . Bipolar disorder current episode depressed [F31.30] 07/16/2014  . Drug overdose [T50.901A] 07/15/2014  . Acute encephalopathy [G93.40] 07/15/2014  . Hypokalemia [E87.6]      Continued Clinical Symptoms:  Alcohol Use Disorder Identification Test Final Score (AUDIT): 28 The "Alcohol Use Disorders Identification Test", Guidelines for Use in Primary Care, Second Edition.  World Science writerHealth Organization [facility](WHO). Score between 0-7:  no or low risk or alcohol related problems. Score between 8-15:  moderate risk of alcohol related problems. Score between 16-19:  high risk of alcohol related problems. Score 20 or above:  warrants further diagnostic evaluation for alcohol dependence and treatment.   CLINICAL FACTORS:   Severe Anxiety and/or Agitation Bipolar Disorder:   Depressive phase Dysthymia Alcohol/Substance Abuse/Dependencies More than one psychiatric diagnosis Unstable or Poor Therapeutic Relationship   Musculoskeletal: Strength & Muscle Tone: within normal limits Gait & Station: unsteady Patient leans: N/A  Psychiatric Specialty Exam: Physical Exam  Constitutional: She appears well-developed and well-nourished.  HENT:  Head: Normocephalic.    Review of Systems  Constitutional: Negative for fever.  Cardiovascular: Negative for chest pain.  Skin: Negative for rash.  Neurological: Positive for tremors.  Psychiatric/Behavioral: Positive for depression and substance abuse. The patient is nervous/anxious and has insomnia.     Blood pressure 144/80, pulse 84, temperature 98.7 F (37.1 C), temperature source Oral, resp. rate  18, height 5\' 1"  (1.549 m), weight 72.405 kg (159 lb 10 oz), SpO2 100 %.Body mass index is 30.18 kg/(m^2).  General Appearance: Casual  Eye Contact::  Fair  Speech:  Slow  Volume:  Normal  Mood:  Depressed and Dysphoric  Affect:  Congruent and Constricted  Thought Process:  Coherent  Orientation:  Full (Time, Place, and Person)  Thought Content:  Rumination  Suicidal Thoughts:  No  Homicidal Thoughts:  No  Memory:  Immediate;   Fair Recent;   Fair  Judgement:  Poor  Insight:  Shallow  Psychomotor Activity:  Decreased  Concentration:  Fair  Recall:  FiservFair  Fund of Knowledge:Fair  Language: Fair  Akathisia:  Negative  Handed:  Right  AIMS (if indicated):     Assets:  Communication Skills Desire for Improvement Social Support  Sleep:  Number of Hours: 4.5  Cognition: WNL  ADL's:  Intact     COGNITIVE FEATURES THAT CONTRIBUTE TO RISK:  Closed-mindedness and Polarized thinking    SUICIDE RISK:   Moderate:  Frequent suicidal ideation with limited intensity, and duration, some specificity in terms of plans, no associated intent, good self-control, limited dysphoria/symptomatology, some risk factors present, and identifiable protective factors, including available and accessible social support.  PLAN OF CARE: Alcohol detox started. SA groups and stabilize with medications.   Medical Decision Making:  Review of Psycho-Social Stressors (1), Established Problem, Worsening (2), Review of Last Therapy Session (1) and Review of Medication Regimen & Side Effects (2)  I certify that inpatient services furnished can reasonably be expected to improve the patient's condition.   Kincade Granberg 07/17/2014, 10:34 AM

## 2014-07-17 NOTE — [facility] Group Notes (Addendum)
[facility] Group Notes:  (Clinical Social Work)  07/17/2014   1:15-2:15PM  Summary of Progress/Problems:  The main focus of today's process group was to   identify the patient's current support system and decide on other supports that can be put in place.  The picture on workbook was used to discuss why additional supports are needed.  An emphasis was placed on using counselor, doctor, therapy groups, 12-step groups, and problem-specific support groups to expand supports.   There was also an extensive discussion about what constitutes a healthy support versus an unhealthy support.  The patient expressed full comprehension of the concepts presented.  One current healthy support is her boyfriend with whom she lives, as well as her 40yo son, and some other family members.  Her current unhealthy support is her mother, who got out of jail recently and came to live with her.  She is going to tell her mother while she is still here in the hospital that she has to leave the home prior to pt coming home from the hospital.  She told her mother prior to group that she was not going to argue with her, and hung up the phone.  She provided much encouragement and humor to other members of the group.  One of her plans for discharge is to attend 90 meetings in 90 days.  She also intends to get a sponsor.  Type of Therapy:  Process Group  Participation Level:  Active  Participation Quality:  Attentive and Sharing  Affect:  Blunted  Cognitive:  Appropriate and Oriented  Insight:  Engaged  Engagement in Therapy:  Engaged  Modes of Intervention:  Education,  Support and [employer] FoodsProcessing  Essam Lowdermilk Grossman-Orr, LCSW 07/17/2014, 4:00pm

## 2014-07-17 NOTE — [facility] Group Notes (Signed)
[facility] Group Notes:  (Nursing/MHT/Case Management/Adjunct)  Date:  07/17/2014  Time:  10:40 AM  Type of Therapy:  Psychoeducational Skills  Participation Level:  Active  Participation Quality:  Appropriate  Affect:  Appropriate  Cognitive:  Appropriate  Insight:  Appropriate  Engagement in Group:  Engaged  Modes of Intervention:  Discussion  Summary of Progress/Problems: Pt did attend self inventory group, pt reported that she was negative SI/HI, no AH/VH noted. Pt rated her depression as a 5 and her helplessness/hopelessness as a 0.     Pt reported no issues or concerns.   Jacquelyne BalintForrest, Kaikoa Magro Shanta 07/17/2014, 10:40 AM

## 2014-07-17 NOTE — Progress Notes (Signed)
D: Pt has anxious affect and depressed mood.  Pt reported withdrawal symptoms of tremor, sweats, anxiety, and agitation.  Pt reports her goal tonight is "to try to get some rest."  Pt denies SI/HI, denies hallucinations, denies pain.  Pt reported "the only reason I took them pills was so I could get right in a place like this.  I'm ready to stop giving all my money to the dope man and I'm ready to stop living my life like this."  Pt reports she plans to go to "90 days 90 meetings" after discharge.  A: Introduced self to pt and met with pt 1:1.  Initial treatment plan developed with pt.  Actively listened to pt and provided support and encouragement.  PRN medication administered for anxiety and sleep, see flow sheet.  On-call provider called and Librium protocol was ordered.  Medications administered per order.  PO fluids encouraged and provided.   R: Pt is compliant with medications.  She verbally contracts for safety and reports that she will notify staff of needs and concerns.  Will continue to monitor and assess.

## 2014-07-17 NOTE — Progress Notes (Signed)
Patient ID: Amy BryantMisty Maslowski, female   DOB: September 12, 1975, 39 y.o.   MRN: [id number]   D: Pt has been very flat and depressed on the unit today. Pt reported that she was bad withdrawal symptoms and that the medication was helping a little. Pt reported that her depression was a 5, and that her anxiety was a 10. Pt reported that her goal for today was to get her mind back together. Pt took all medications without any problems, no other issues or concerns noted. Pt reported being negative SI/HI, no AH/VH noted. A: 15 min checks continued for patient safety. R: Pt safety maintained.
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
## 2014-07-17 NOTE — Progress Notes (Signed)
Psychoeducational Group Note  Date: 07/17/2014 Time:0930 Group Topic/Focus:  Gratefulness:  The focus of this group is to help patients identify what two things they are most grateful for in their lives. What helps ground them and to center them on their work to their recovery.  Participation Level:  Active  Participation Quality:  Appropriate  Affect:  Appropriate  Cognitive:  Oriented  Insight:  Improving  Engagement in Group:  Engaged  Additional Comments:  Pt participated in the group and shared thoughts and feelings.  Sheranda Seabrooks A  

## 2014-07-17 NOTE — Progress Notes (Signed)
Patient did attend the evening speaker AA meeting.  

## 2014-07-18 DIAGNOSIS — F1920 Other psychoactive substance dependence, uncomplicated: Secondary | ICD-10-CM | POA: Diagnosis present

## 2014-07-18 DIAGNOSIS — F1020 Alcohol dependence, uncomplicated: Secondary | ICD-10-CM

## 2014-07-18 DIAGNOSIS — F1994 Other psychoactive substance use, unspecified with psychoactive substance-induced mood disorder: Secondary | ICD-10-CM

## 2014-07-18 DIAGNOSIS — F4310 Post-traumatic stress disorder, unspecified: Secondary | ICD-10-CM | POA: Diagnosis present

## 2014-07-18 MED ORDER — FAMOTIDINE 20 MG PO TABS
20.0000 mg | ORAL_TABLET | Freq: Two times a day (BID) | ORAL | Status: DC
  Administered 2014-07-18 – 2014-07-19 (×2): 20 mg via ORAL
  Filled 2014-07-18 (×7): qty 1

## 2014-07-18 NOTE — Progress Notes (Signed)
D:  Patient has been nauseated and vomited this morning.  Patient refused zofran and did not take 0800 medications.  Patient given crackers and ginger ale, slept, woke up about lunch time and demanded to go to dining room.  T 97.7.  Patient took 0800 medications after lunch.  Patient woke up and was upset, yelled at MHT.  Every sentence contains cuss words.   A:  0800 medications given after lunch.  Emotional support and encouragement given patient. R:  Denied SI and HI.  Denied A/V hallucinations.  Safety maintained with 15 minute checks.
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
## 2014-07-18 NOTE — Progress Notes (Signed)
D: Pt denies SI/HI, denies hallucinations. Pt has been visible in milieu interacting with peers and staff appropriately.   A: Introduced self to pt. Supported and encouraged pt. Medications administered per order.   R: Pt is compliant with medications. Pt verbally contracts for safety. Will continue to monitor and assess. 

## 2014-07-18 NOTE — Progress Notes (Signed)
D: Pt presents anxious in affect and mood. Pt verbalizes having indigestion with an "egg taste like burp". No current N&V. Pt started on Pepcid. Pt is negative for any SI/HI/AVH. Pt reports a plan to ignore any negativity from her mom who lives with her. Pt reports that her mom is a current substance abuser. Pt is compliant with her current plan of care. Pt actively participates within the milieu. A: Writer reviewed qhs medication indications with pt. Writer administered scheduled and prn medications to pt, per MD orders. Continued support and availability as needed was extended to this pt. Staff continue to monitor pt with q4715min checks.  R: No adverse drug reactions noted. Pt receptive to treatment. Pt remains safe at this time.

## 2014-07-18 NOTE — [facility] Group Notes (Signed)
[facility]   07/18/2014 10:30 AM  Participation Quality:  Invited-DID NOT ATTEND. Pt has been sick/vomitting in room this morning. RN aware/MD notified.   Smart, American FinancialHeather LCSWA

## 2014-07-18 NOTE — Plan of Care (Signed)
Problem: Consults Goal: Suicide Risk Patient Education (See Patient Education module for education specifics)  Outcome: Completed/Met Date Met:  07/18/14 Nurse discussed suicidal thoughts/ coping skills with patient.     

## 2014-07-18 NOTE — [facility] Group Notes (Signed)
[facility] LCSW Group Therapy 07/18/2014  1:15 pm  Type of Therapy: Group Therapy Participation Level: Active  Participation Quality: Attentive, Sharing and Supportive  Affect: Irritable, anxious  Cognitive: Alert and Oriented  Insight: Developing/Improving and Engaged  Engagement in Therapy: Developing/Improving and Engaged  Modes of Intervention: Clarification, Confrontation, Discussion, Education, Exploration,  Limit-setting, Orientation, Problem-solving, Rapport Building, Dance movement psychotherapisteality Testing, Socialization and Support  Summary of Progress/Problems: Pt identified obstacles faced currently and processed barriers involved in overcoming these obstacles. Pt identified steps necessary for overcoming these obstacles and explored motivation (internal and external) for facing these difficulties head on. Pt further identified one area of concern in their lives and chose a goal to focus on for today. Patient identified her relationship with her mother as an obstacle to her wellbeing and recovery. Patient discussed her need to establish boundaries and ask her mother to leave residence. Patient asked for feedback from the group about how to go about this task. CSW and other group members provided patient with support and encouragement. Patient left group and returned a short time later to inform the group that she had called her mother to request that mother move from residence and that mother had agreed. Patient expressed feeling supported by group before leaving again without returning.  Amy BruinKristin Keni Wafer, MSW, [employer]CSWA Clinical Social Worker [facility] [phone]

## 2014-07-18 NOTE — Tx Team (Signed)
Interdisciplinary Treatment Plan Update (Adult)   Date: 07/18/2014  Time Reviewed: Progress in Treatment:  Attending groups: No  Participating in groups:  No  Taking medication as prescribed: Yes  Tolerating medication: Yes  Family/Significant othe contact made: Pt refused. SPE completed with pt.   Patient understands diagnosis: Yes, AEB seeking treatment for SI/overdose on medication, polysubstance abuse (alcohol, pain meds, marijuana, crack cocaine, heroin), depression/hx of bipolar disorder, and medication stabilization.  Discussing patient identified problems/goals with staff: Yes  Medical problems stabilized or resolved: Yes  Denies suicidal/homicidal ideation: Yes during group/self report.  Patient has not harmed self or Others: Yes  New problem(s) identified:  Discharge Plan or Barriers: Pt did not attend d/c planning group due to severity of withdrawals. Pt plans to return home at d/c and [facility].  Additional comments: Amy [facility] is a 39 year old Caucasian female. [facility] with complaints of suicide attempts by overdose on Trazodone tablets. She reports, "I called the ambulance on Thursday night. I was drinking a lot, shooting cocaine, popping pills & smoking weeds all day that day. All of a sudden, it hits me. I asked myself; what are you doing to yourself?. I said, I'm sick of this drug use. This got to stop. I have been using drugs since age 39. My drug use worsened since January of this year after I had surgery & was prescribed opioid for my pains. The use of the pain pills triggered my addiction at it's worse. While trying to undope myself, I keep sinking deeper & deeper into addiction. I'm now ready to kick this habit because I have a lot to lose. I will be starting treatment with [facility] in [place] after my discharge from here. I have bad nightmares from being tired up by drug dealers, so I use drugs to knock myself out at night  so I don't remember my nightmares. I also learned to cut on my wrists about 20 years ago to cope & to get attention, never to kill myself. I was diagnosed with Bipolar disorder 22 years ago. I was seeing Dr. Rosalia Hammersay who put me on Latuda & Lamictal. Kasandra KnudsenLatuda is not helping me. I was never suicidal. I took the Trazodone to act like I was suicidal just to get into [facility] for drug detox. Reason for Continuation of Hospitalization: Librium taper/clonidine taper-withdrawals Medication stabilization Mood stabilization/depression Estimated length of stay: 3-5 days  For review of initial/current patient goals, please see plan of care.  Attendees:  Patient:    Family:    Physician: Geoffery LyonsIrving Lugo MD 07/18/2014 11:09 AM   Nursing: Harlow OhmsVivian, Beverly, Ronecia RN 07/18/2014 11:09 AM   Clinical Social Worker Ona Roehrs Smart, LCSWA  07/18/2014 11:09 AM   Other: Trilby LeaverKristin D. LCSWA; Quylle H. LCSW 07/18/2014 11:09 AM   Other: Darden DatesJennifer C. Nurse CM 07/18/2014 11:09 AM   Other: Liliane Badeolora Sutton, Community Care Coordinator  07/18/2014 11:09 AM   Other:    Scribe for Treatment Team:  Herbert SetaHeather Smart LCSWA 07/18/2014 11:09 AM

## 2014-07-19 DIAGNOSIS — F1920 Other psychoactive substance dependence, uncomplicated: Secondary | ICD-10-CM

## 2014-07-19 MED ORDER — DISULFIRAM 250 MG PO TABS: 250.0000 mg | ORAL_TABLET | Freq: Every day | ORAL | Status: DC

## 2014-07-19 MED ORDER — TIOTROPIUM BROMIDE MONOHYDRATE 18 MCG IN CAPS: 18.0000 ug | ORAL_CAPSULE | Freq: Every day | RESPIRATORY_TRACT | Status: DC

## 2014-07-19 MED ORDER — GABAPENTIN 100 MG PO CAPS: 100.0000 mg | ORAL_CAPSULE | Freq: Three times a day (TID) | ORAL | Status: DC

## 2014-07-19 MED ORDER — QUETIAPINE FUMARATE 100 MG PO TABS: 100.0000 mg | ORAL_TABLET | Freq: Every day | ORAL | Status: DC

## 2014-07-19 MED ORDER — DISULFIRAM 250 MG PO TABS
250.0000 mg | ORAL_TABLET | Freq: Every day | ORAL | Status: DC
  Administered 2014-07-19: 250 mg via ORAL
  Filled 2014-07-19 (×2): qty 1
  Filled 2014-07-19: qty 3
  Filled 2014-07-19: qty 1

## 2014-07-19 MED ORDER — PRAZOSIN HCL 1 MG PO CAPS: 1.0000 mg | ORAL_CAPSULE | Freq: Every day | ORAL | Status: DC

## 2014-07-19 MED ORDER — ACAMPROSATE CALCIUM 333 MG PO TBEC: 666.0000 mg | DELAYED_RELEASE_TABLET | Freq: Three times a day (TID) | ORAL | Status: DC

## 2014-07-19 MED ORDER — ALBUTEROL SULFATE (2.5 MG/3ML) 0.083% IN NEBU: 2.5000 mg | INHALATION_SOLUTION | Freq: Four times a day (QID) | RESPIRATORY_TRACT | Status: AC | PRN

## 2014-07-19 MED ORDER — GABAPENTIN 100 MG PO CAPS
100.0000 mg | ORAL_CAPSULE | Freq: Three times a day (TID) | ORAL | Status: DC
  Administered 2014-07-19: 100 mg via ORAL
  Filled 2014-07-19: qty 9
  Filled 2014-07-19 (×4): qty 1
  Filled 2014-07-19 (×2): qty 9

## 2014-07-19 MED ORDER — ACAMPROSATE CALCIUM 333 MG PO TBEC
666.0000 mg | DELAYED_RELEASE_TABLET | Freq: Three times a day (TID) | ORAL | Status: DC
  Administered 2014-07-19: 666 mg via ORAL
  Filled 2014-07-19 (×2): qty 2
  Filled 2014-07-19 (×2): qty 18
  Filled 2014-07-19: qty 2
  Filled 2014-07-19: qty 18
  Filled 2014-07-19: qty 2

## 2014-07-19 MED ORDER — SILVER SULFADIAZINE 1 % EX CREA: TOPICAL_CREAM | Freq: Every day | CUTANEOUS | Status: DC

## 2014-07-19 NOTE — Clinical Social Work Note (Signed)
CSW contacted [facility] transportation. They will pick up pt at approximately 3pm and transport her to [facility], where pt has arranged for friend to take her home.   The Sherwin-WilliamsHeather [facility]ings, LCSWA  07/19/2014 11:50 AM

## 2014-07-19 NOTE — [facility] Group Notes (Signed)
0900 nursing orientation group   The focus of this group is to educate the patient on the purpose and policies of crisis stabilization and provide a format to answer questions about their admission.  The group details unit policies and expectations of patients while admitted.  Pt did not attend she was in bed and refused to get up for the group.  She did state,"I am leaving today so what"

## 2014-07-19 NOTE — [facility] Suicide Risk Assessment (Signed)
[facility] INPATIENT:  Family/Significant Other Suicide Prevention Education  Suicide Prevention Education:  Patient Refusal for Family/Significant Other Suicide Prevention Education: The patient Amy [facility] has refused to provide written consent for family/significant other to be provided Family/Significant Other Suicide Prevention Education during admission and/or prior to discharge.  Physician notified.  SPE completed with pt and SPI pamphlet provided. She was encouraged to share information with support network, ask questions, and talk about any concerns relating to SPE. Pt also given Mobile Crisis information.   Smart, Cohan Stipes LCSWA  07/19/2014, 9:48 AM

## 2014-07-19 NOTE — Progress Notes (Signed)
Pt was hypotensive this am. On-call provider notified and encouraged to force fluids. Bentyl was given for pt's complaint of abdominal cramps. Pt agrees to increase fluid intake. Pitcher of water provided. On-coming nurse informed of need for Bp recheck.
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
## 2014-07-19 NOTE — Progress Notes (Signed)
  [facility] Discharge Plan :  Will you be returning to the same living situation after discharge:  Yes,  home At discharge, do you have transportation home?: Yes,  friend/family member Do you have the ability to pay for your medications: Yes,  [place] Medicaid  Release of information consent forms completed and submitted to Medical Records by CSW.  Patient to Follow up at: Follow-up Information    [facility] [place].   Why:  Appt for hospital followup/medication management on Friday, 07/22/14. Walk in between 8:30AM-10:30AM. Referral number: [id number]   Contact information:   [address] [place], [place] [address] Phone: [phone] Fax: [phone]       Patient denies SI/HI: Yes,  during group/self report.     Safety Planning and Suicide Prevention discussed: Yes,  Pt refused to allow family contact. SPE completed with pt and she was provided with SPI pamphlet. Pt encouraged to share information with support network, and was also provided with Mobile Crisis   Have you used any form of tobacco in the last 30 days? (Cigarettes, Smokeless Tobacco, Cigars, and/or Pipes): Yes  Has patient been referred to the Quitline?: Patient refused referral  Smart, Lebron QuamHeather LCSWA  07/19/2014, 9:52 AM
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
## 2014-07-19 NOTE — [facility] Group Notes (Signed)
[facility] LCSW Group Therapy  07/19/2014 2:29 PM  Type of Therapy:  Group Therapy  Participation Level:  Did Not Attend-Invited-stated that her stomach hurt and she wanted to stay in bed.   Summary of Progress/Problems: [facility] Speaker came to talk about his personal journey with substance abuse and addiction. The pts processed ways by which to relate to the speaker. [facility] speaker provided handouts and educational information pertaining to groups and services offered by the [facility].   Smart, [name] LCSWA  07/19/2014, 2:29 PM

## 2014-07-22 NOTE — Progress Notes (Signed)
Patient Discharge Instructions:  After Visit Summary (AVS):   Faxed to:  07/22/14 Discharge Summary Note:   Faxed to:  07/22/14 Psychiatric Admission Assessment Note:   Faxed to:  07/22/14 Suicide Risk Assessment - Discharge Assessment:   Faxed to:  07/22/14 Faxed/Sent to the Next Level Care provider:  07/22/14 [facility] @ [phone]  Jerelene ReddenSheena E Woolsey, 07/22/2014, 3:32 PM

## 2016-07-14 ENCOUNTER — Emergency Department (HOSPITAL_COMMUNITY)
Admission: EM | Admit: 2016-07-14 | Discharge: 2016-07-14 | Disposition: A | Discharge status: Home / Self Care | Payer: Medicaid Other | Attending: Emergency Medicine | Admitting: Emergency Medicine

## 2016-07-14 ENCOUNTER — Encounter (HOSPITAL_COMMUNITY): Payer: Self-pay | Admitting: Emergency Medicine

## 2016-07-14 DIAGNOSIS — M25511 Pain in right shoulder: Secondary | ICD-10-CM | POA: Insufficient documentation

## 2016-07-14 DIAGNOSIS — Z79899 Other long term (current) drug therapy: Secondary | ICD-10-CM | POA: Diagnosis not present

## 2016-07-14 DIAGNOSIS — G5601 Carpal tunnel syndrome, right upper limb: Secondary | ICD-10-CM | POA: Diagnosis not present

## 2016-07-14 DIAGNOSIS — M25531 Pain in right wrist: Secondary | ICD-10-CM | POA: Diagnosis present

## 2016-07-14 DIAGNOSIS — F17210 Nicotine dependence, cigarettes, uncomplicated: Secondary | ICD-10-CM | POA: Insufficient documentation

## 2016-07-14 DIAGNOSIS — R05 Cough: Secondary | ICD-10-CM | POA: Insufficient documentation

## 2016-07-14 MED ORDER — ACETAMINOPHEN 500 MG PO TABS
1000.0000 mg | ORAL_TABLET | Freq: Once | ORAL | Status: AC
  Administered 2016-07-14: 1000 mg via ORAL
  Filled 2016-07-14: qty 2

## 2016-07-14 MED ORDER — KETOROLAC TROMETHAMINE 30 MG/ML IJ SOLN
30.0000 mg | Freq: Once | INTRAMUSCULAR | Status: DC
  Filled 2016-07-14: qty 1

## 2016-07-14 MED ORDER — KETOROLAC TROMETHAMINE 30 MG/ML IJ SOLN
30.0000 mg | Freq: Once | INTRAMUSCULAR | Status: AC
  Administered 2016-07-14: 30 mg via INTRAMUSCULAR

## 2016-07-14 MED ORDER — ACETAMINOPHEN 500 MG PO TABS
ORAL_TABLET | ORAL | Status: AC
  Filled 2016-07-14: qty 2

## 2016-07-14 NOTE — Discharge Instructions (Signed)
Your right wrist pain and right shoulder pain are most likely from overuse and repetitive movements during work. I suspect that your right wrist pain is from an inflammation of your median nerve, this is called carpal tunnel syndrome. Please read the attached information on carpal tunnel syndrome. We will treat your wrist pain and shoulder pain similarly.  Please wear wrist splint at least nightly, rest, ice and take high-dose anti-inflammatory medications. Please wear your wrist splint at least nightly, you may wear it throughout the day if you're able to. Please take 600 mg of ibuprofen +1000 g of Tylenol every 8 hours for the next 5-7 days to decrease inflammation. Ice your wrist. Rest, avoid wrist movements that exacerbate the pain.  I suspect that your right shoulder pain is from a soft tissue overuse injury, inflammation. I do not think that there is a bony injury and there is no need for x-rays today. We will treat your shoulder pain similarly to your wrist pain with the treatment described below. You may wear your shoulder sling during the day to avoid painful movements. Your pain should improve within the next week. Follow up with a primary care provider if your pain is not well controlled and does not improve with rest and anti-inflammatory medications. Return to the emergency department if you feel right arm weakness, prolonged numbness, or tingling.

## 2016-07-14 NOTE — ED Triage Notes (Signed)
Patient c/o right wrist, elbow, and collar-bone pain. Patient denies any injury. Per patient works at [employer] and has repetitive motions that rotate wrist causing pain to increase. Patient states pain x2 weeks in wrist and elbow but collarbone pain started this morning. Patient reports using icy hot and bc powders with no relief.

## 2016-07-14 NOTE — ED Provider Notes (Signed)
[facility]-EMERG[facility]Y DEPT Provider Note   CSN: [id number] Arrival date & time: 07/14/16  1459  By signing my name below, I, [name], attest that this documentation has been prepared under the direction and in the presence of Sharen Heck, PA-C.  Electronically Signed: [name], ED Scribe. 07/14/16. 4:03 PM.   History   Chief Complaint Chief Complaint  Patient presents with  . Arm Pain    HPI Amy Cummings is a 41 y.o. female who presents to the Emergency Department complaining of gradually worsening right wrist pain x "a few weeks" which has been worsened in the last two days.  She also reports right shoulder pain that started this morning at church. Wrist pain is at volar aspect, and radiates to her elbow. Wrist movement exacerbates her pain. 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  She reports her wrist pain is always worse after work and at night.  She reports right middle finger numbness at night which is relieved by shaking her hands.  Took 2 BC ~ 4 hours ago with no relief. Pt notes she broke her right wrist when she was younger and one of her collar bones but is unable to remember which collar bone. No recent falls, MVC, injuries or traumas. Pt denies fever, IVDU and any other sx at this time. No weakness to right upper extremity.   The history is provided by the patient. No language interpreter was used.    Past Medical History:  Diagnosis Date  . Anxiety   . Bipolar 1 disorder (HCC)   . Panic attack     Patient Active Problem List   Diagnosis Date Noted  . Polysubstance (including opioids) dependence with physiological dependence (HCC) 07/18/2014  . PTSD (post-traumatic stress disorder) 07/18/2014  . Alcohol use disorder, moderate, dependence (HCC)   . Substance induced mood disorder (HCC)   . Bipolar disorder current episode depressed (HCC) 07/16/2014  . Drug overdose 07/15/2014  . Hypokalemia      Past Surgical History:  Procedure Laterality Date  . ABDOMINAL HYSTERECTOMY    . TUBAL LIGATION      OB History    Gravida Para Term Preterm AB Living   SAB TAB Ectopic Multiple Live Births                   Home Medications    Prior to Admission medications   Medication Sig Start Date End Date Taking? Authorizing Provider  acamprosate (CAMPRAL) 333 MG tablet Take 2 tablets (666 mg total) by mouth 3 (three) times daily with meals. For alcohol addiction 07/19/14   Sanjuana Kava, NP  albuterol (PROVENTIL) (2.5 MG/3ML) 0.083% nebulizer solution Take 3 mLs (2.5 mg total) by nebulization 4 (four) times daily as needed for wheezing or shortness of breath. 07/19/14   Sanjuana Kava, NP  disulfiram (ANTABUSE) 250 MG tablet Take 1 tablet (250 mg total) by mouth daily. For alcohol addiction 07/19/14   Sanjuana Kava, NP  gabapentin (NEURONTIN) 100 MG capsule Take 1 capsule (100 mg total) by mouth 3 (three) times daily. For substance withdrawal syndrome/agitation 07/19/14   Sanjuana Kava, NP  prazosin (MINIPRESS) 1 MG capsule Take 1 capsule (1 mg total) by mouth at bedtime. For nightmares 07/19/14   Sanjuana Kava, NP  QUEtiapine (SEROQUEL) 100 MG tablet Take 1 tablet (100 mg total) by mouth at bedtime.  For mood control 07/19/14   Sanjuana Kava, NP  silver sulfADIAZINE (SILVADENE) 1 % cream Apply topically daily. For burn woods 07/19/14   Sanjuana Kava, NP  tiotropium (SPIRIVA) 18 MCG inhalation capsule Place 1 capsule (18 mcg total) into inhaler and inhale daily. For sleep 07/19/14   Sanjuana Kava, NP    Family History No family history on file.  Social History Social History  Substance Use Topics  . Smoking status: Current Every Day Smoker    Packs/day: 1.00    Years: 20.00    Types: Cigarettes  . Smokeless tobacco: Former Neurosurgeon  . Alcohol use No     Allergies   Geodon [ziprasidone hydrochloride]   Review of Systems Review of Systems  Constitutional: Negative for  fever.  Respiratory: Positive for cough.   Musculoskeletal: Positive for arthralgias and joint swelling.     Physical Exam Updated Vital Signs BP 140/75 (BP Location: Left Arm)   Pulse 72   Temp 97.8 F (36.6 C) (Oral)   Resp 18   Ht  (1.575 m)   Wt 140 lb (63.5 kg)   LMP 01/10/2013   SpO2 97%   BMI 25.61 kg/m   Physical Exam  Constitutional: She appears well-developed and well-nourished. No distress.  HENT:  Head: Normocephalic and atraumatic.  Neck: Neck supple.  Cardiovascular: Normal rate, regular rhythm and normal heart sounds.   No murmur heard. Pulmonary/Chest: Effort normal and breath sounds normal. No respiratory distress. She has no wheezes. She has no rales.  Musculoskeletal: Normal range of motion. She exhibits tenderness.  Right trapezius tenderness.  Tenderness at biceps tendon groove Pain with shoulder flexion and IR/ER  +Neer test.  +Empty can test Pain with shoulder abduction against resistance +Yergason's and Speed's tests Tenderness to medial nerve +Tinnel test. +Phalen test.   Neurological: She is alert.  5/5 strength with shoulder raise, abduction and adduction, bilaterally.  5/5 strength with elbow flexion and extension, bilaterally.  5/5 strength with wrist flexion and extension.  5/5 strength with finger abduction Good pincer and hand grip bilaterally.  Sensation to light touch intact in median, ulnar and radial nerve distribution, bilaterally.   Skin: Skin is warm and dry.  Nursing note and vitals reviewed.    ED Treatments / Results  DIAGNOSTIC STUDIES: Oxygen Saturation is 100% on RA, normal by my interpretation.    COORDINATION OF CARE: 4:01 PM-Discussed next steps with pt which includes ice and rest. Pt verbalized understanding and is agreeable with the plan.    Labs (all labs ordered are listed, but only abnormal results are displayed) Labs Reviewed - No data to display  EKG  EKG Interpretation None        Radiology No results found.  Procedures Procedures (including critical care time)  Medications Ordered in ED Medications  acetaminophen (TYLENOL) 500 MG tablet (not administered)  acetaminophen (TYLENOL) tablet 1,000 mg (1,000 mg Oral Given 07/14/16 1608)  ketorolac (TORADOL) 30 MG/ML injection 30 mg (30 mg Intramuscular Given 07/14/16 1608)     Initial Impression / Assessment and Plan / ED Course  I have reviewed the triage vital signs and the nursing notes.  Pertinent labs & imaging results that were available during my care of the patient were reviewed by me and considered in my medical decision making (see chart for details).    Suspect carpal tunnel syndrome to the right wrist given history of repetitive overuse wrist movements, middle finger numbness and tingling worse at bedtime and  physical exam findings. No history of trauma or falls to suggest bony injuries. No imaging indicated at this time. Wrist pain will be treated with splint, anti-inflammatories, ice, rest, activity modification. Patient advised to follow-up with PCP in 1-2 weeks if her symptoms do not start to improve. She is to return to the emergency department if she notices weakness or prolonged tingling/numbness to the right hand. I think her right shoulder pain is also associated with her repetitive overuse of right extremity at work. 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 She is right-handed .  Possibly biceps tendinitis or bursitis. Less likely slap tear or rotator cuff injury. Advised patient to rest and wear shoulder sling to provide comfort. Anti-inflammatories, ice, rest and activity modification for her shoulder pain. Patient given a work note to rest for 2-3 days. I do not think the patient needs imaging for her shoulder today. She notes that she does have a previous history of rotator cuff injury, she is unable to tell me the details and  which shoulder was injured. I advised her to follow up with her PCP who may refer her to an orthopedist or recommend an MRI if her right shoulder pain and right wrist pain continue and do not improve with conservative treatment. Patient verbalized understanding and is agreeable to ED treatment and discharge plan. All questions and concerns were addressed prior to discharge.  Final Clinical Impressions(s) / ED Diagnoses   Final diagnoses:  Right wrist pain  Acute pain of right shoulder  Carpal tunnel syndrome of right wrist    New Prescriptions Discharge Medication List as of 07/14/2016  4:22 PM     I personally performed the services described in this documentation, which was scribed in my presence. The recorded information has been reviewed and is accurate.     Liberty Handy, PA-C 07/14/16 1717    Eber Hong, MD 07/17/16 [phone]

## 2017-09-09 ENCOUNTER — Encounter: Payer: Self-pay | Admitting: Internal Medicine

## 2017-12-04 ENCOUNTER — Ambulatory Visit: Payer: Medicaid Other | Admitting: Gastroenterology

## 2017-12-04 ENCOUNTER — Telehealth: Payer: Self-pay | Admitting: *Deleted

## 2017-12-04 ENCOUNTER — Encounter: Payer: Self-pay | Admitting: Gastroenterology

## 2017-12-04 ENCOUNTER — Encounter: Payer: Self-pay | Admitting: *Deleted

## 2017-12-04 DIAGNOSIS — B182 Chronic viral hepatitis C: Secondary | ICD-10-CM

## 2017-12-04 DIAGNOSIS — B1920 Unspecified viral hepatitis C without hepatic coma: Secondary | ICD-10-CM | POA: Insufficient documentation

## 2017-12-04 NOTE — Telephone Encounter (Signed)
PA for ultrasound completed via [employer] website. PA has been approved [id number] dates 12/04/17-01/03/18

## 2017-12-04 NOTE — Assessment & Plan Note (Addendum)
42 year old female presenting for management of chronic hepatitis C.  Initially told that she had hepatitis C about 19 years ago.  She has never been treated.  Recent labs from PCP showed positive hepatitis C antibody.  Risk factors include previous IV drug use.  We discussed natural history of hepatitis C, modes of transmission, treatment options, the fact that she can also get reinfected even after she is cured from current infection.  We will check labs including HCVRNA to confirm chronic infection.  Plan for abdominal ultrasound with elastography.  Patient also complains of postprandial right upper quadrant pain and we will be able to evaluate her gallbladder at that time.  I discussed at length with patient, need for compliance with labs, office visits, medications.  She is very anxious to be treated for hepatitis C and is willing to comply with recommendations.  Patient states that she does not want anyone to be given any of her medical information including family, mother etc.

## 2017-12-04 NOTE — Patient Instructions (Signed)
1. Please go have your labs done at [facility] at your earliest convenience.  2. Have your ultrasound done as scheduled.    Hepatitis C Hepatitis C is a viral infection of the liver. It can lead to scarring of the liver (cirrhosis), liver failure, or liver cancer. Hepatitis C may go undetected for months or years because people with the infection may not have symptoms, or they may have only mild symptoms. What are the causes? This condition is caused by the hepatitis C virus (HCV). The virus can spread from person to person (is contagious) through:  Blood.  Childbirth. A woman who has hepatitis C can pass it to her baby during birth.  Bodily fluids, such as breast milk, tears, semen, vaginal fluids, and saliva.  Blood transfusions or organ transplants done in the [place] before 1992.  What increases the risk? The following factors may make you more likely to develop this condition:  Having contact with unclean (contaminated) needles or syringes. This may result from: ? Acupuncture. ? Tattoing. ? Body piercing. ? Injecting drugs.  Having unprotected sex with someone who is infected.  Needing treatment to filter your blood (kidney dialysis).  Having HIV (human immunodeficiency virus) or AIDS (acquired immunodeficiency syndrome).  Working in a job that involves contact with blood or bodily fluids, such as health care.  What are the signs or symptoms? Symptoms of this condition include:  Fatigue.  Loss of appetite.  Nausea.  Vomiting.  Abdominal pain.  Dark yellow urine.  Yellowish skin and eyes (jaundice).  Itchy skin.  Clay-colored bowel movements.  Joint pain.  Bleeding and bruising easily.  Fluid building up in your stomach (ascites).  In some cases, you may not have any symptoms. How is this diagnosed? This condition is diagnosed with:  Blood tests.  Other tests to check how well your liver is functioning. They may include: ? Magnetic resonance  elastography (MRE). This imaging test uses MRIs and sound waves to measure liver stiffness. ? Transient elastography. This imaging test uses ultrasounds to measure liver stiffness. ? Liver biopsy. This test requires taking a small tissue sample from your liver to examine it under a microscope.  How is this treated? Your health care provider may perform noninvasive tests or a liver biopsy to help decide the best course of treatment. Treatment may include:  Antiviral medicines and other medicines.  Follow-up treatments every 6-12 months for infections or other liver conditions.  Receiving a donated liver (liver transplant).  Follow these instructions at home: Medicines  Take over-the-counter and prescription medicines only as told by your health care provider.  Take your antiviral medicine as told by your health care provider. Do not stop taking the antiviral even if you start to feel better.  Do not take any medicines unless approved by your health care provider, including over-the-counter medicines and birth control pills. Activity  Rest as needed.  Do not have sex unless approved by your health care provider.  Ask your health care provider when you may return to school or work. Eating and drinking  Eat a balanced diet with plenty of fruits and vegetables, whole grains, and lowfat (lean) meats or non-meat proteins (such as beans or tofu).  Drink enough fluids to keep your urine clear or pale yellow.  Do not drink alcohol. General instructions  Do not share toothbrushes, nail clippers, or razors.  Wash your hands frequently with soap and water. If soap and water are not available, use hand sanitizer.  Cover  any cuts or open sores on your skin to prevent spreading the virus.  Keep all follow-up visits as told by your health care provider. This is important. You may need follow-up visits every 6-12 months. How is this prevented? There is no vaccine for hepatitis C. The only  way to prevent the disease is to reduce the risk of exposure to the virus. Make sure you:  Wash your hands frequently with soap and water. If soap and water are not available, use hand sanitizer.  Do not share needles or syringes.  Practice safe sex and use condoms.  Avoid handling blood or bodily fluids without gloves or other protection.  Avoid getting tattoos or piercings in shops or other locations that are not clean.  Contact a health care provider if:  You have a fever.  You develop abdominal pain.  You pass dark urine.  You pass clay-colored stools.  You develop joint pain. Get help right away if:  You have increasing fatigue or weakness.  You lose your appetite.  You cannot eat or drink without vomiting.  You develop jaundice or your jaundice gets worse.  You bruise or bleed easily. Summary  Hepatitis C is a viral infection of the liver. It can lead to scarring of the liver (cirrhosis), liver failure, or liver cancer.  The hepatitis C virus (HCV) causes this condition. The virus can pass from person to person (is contagious).  You should not take any medicines unless approved by your health care provider. This includes over-the-counter medicines and birth control pills. This information is not intended to replace advice given to you by your health care provider. Make sure you discuss any questions you have with your health care provider. Document Released: 03/22/2000 Document Revised: 04/30/2016 Document Reviewed: 04/30/2016 Elsevier Interactive Patient Education  [employer].

## 2017-12-04 NOTE — Progress Notes (Signed)
Primary Care Physician:  Kirstie PeriShah, Ashish, MD  Primary Gastroenterologist:  Roetta SessionsMichael Rourk, MD   Chief Complaint  Patient presents with  . Abdominal Pain    ruq  . Hepatitis C    HPI:  Amy BryantMisty Cummings is a 42 y.o. female here at the request of Dr. Sherryll BurgerShah for further management of Hepatitis C. Patient also complains of RUQ pain, intermittently.   She found out about Hep C about 19 years ago. 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 She has been in prison. She states she was a bad addict back then. Abused alcohol as well. She has 3 children that she does not have custody of, that were adopted out. Twelve years ago she got clean after her last child. Her son is [id number] and she shares custody with his father. This is her motivation to learn from her mistakes. She admits to recent relapse, smoked crack. She reports no use of etoh or IV/intranasal drug use in years.    She states her 43 y/o son was checked as a young child and was negative for Hep C.   Patient wants to be treated. She has been nervous to take the next step. She also c/o ruq pain, colicky in nature, stabbing in quality that occurs several times per week. Brought on by meals. Gallbladder disease runs in her family. She denies heartburn. No bowel concerns. No melena, brbpr.   Abd u/s in 2016 with normal gallbladder, liver unremarkable at that time.   Current Outpatient Medications  Medication Sig Dispense Refill  . albuterol (PROVENTIL HFA;VENTOLIN HFA) 108 (90 Base) MCG/ACT inhaler Inhale into the lungs every 6 (six) hours as needed for wheezing or shortness of breath.    Marland Kitchen. albuterol (PROVENTIL) (2.5 MG/3ML) 0.083% nebulizer solution Take 3 mLs (2.5 mg total) by nebulization 4 (four) times daily as needed for wheezing or shortness of breath. 75 mL 12  . alprazolam (XANAX) 2 MG tablet Take 2 mg by mouth 3 (three) times daily.  0  . lamoTRIgine (LAMICTAL) 200 MG tablet Take 200 mg by mouth daily.  2  . REXULTI 2 MG TABS Take  1 tablet by mouth daily.  2  . traZODone (DESYREL) 100 MG tablet Take 100 mg by mouth as needed.  2   No current facility-administered medications for this visit.     Allergies as of 12/04/2017 - Review Complete 12/04/2017  Allergen Reaction Noted  . Geodon [ziprasidone hydrochloride] Other (See Comments) 10/23/2010    Past Medical History:  Diagnosis Date  . Anxiety   . Bipolar 1 disorder (HCC)   . Panic attack   . PTSD (post-traumatic stress disorder)     Past Surgical History:  Procedure Laterality Date  . ABDOMINAL HYSTERECTOMY    . TUBAL LIGATION      Family History  Problem Relation Age of Onset  . Hypertension Mother   . Thyroid disease Mother   . Kidney disease Mother   . Liver disease Neg Hx   . Colon cancer Neg Hx     Social History   Socioeconomic History  . Marital status: Single    Spouse name: Not on file  . Number of children: Not on file  . Years of education: Not on file  . Highest education level: Not on file  Occupational History  . Not on file  Social Needs  . Financial resource strain: Not on file  . Food insecurity:    Worry: Not on file  Inability: Not on file  . Transportation needs:    Medical: Not on file    Non-medical: Not on file  Tobacco Use  . Smoking status: Current Every Day Smoker    Packs/day: 1.00    Years: 20.00    Pack years: 20.00    Types: Cigarettes  . Smokeless tobacco: Former Engineer, water and Sexual Activity  . Alcohol use: Yes    Comment: no etoh (12/04/17). abused in 2007  . Drug use: Yes    Types: "Crack" cocaine, Marijuana    Comment: marijuana; cocaine few weeks ago (states she relapsed) as of 12/04/17  . Sexual activity: Yes    Birth control/protection: Surgical    Comment: s/p tubal ligation  Lifestyle  . Physical activity:    Days per week: Not on file    Minutes per session: Not on file  . Stress: Not on file  Relationships  . Social connections:    Talks on phone: Not on file    Gets  together: Not on file    Attends religious service: Not on file    Active member of club or organization: Not on file    Attends meetings of clubs or organizations: Not on file    Relationship status: Not on file  . Intimate partner violence:    Fear of current or ex partner: Not on file    Emotionally abused: Not on file    Physically abused: Not on file    Forced sexual activity: Not on file  Other Topics Concern  . Not on file  Social History Narrative  . Not on file      ROS:  General: Negative for anorexia, weight loss, fever, chills, fatigue, weakness. Eyes: Negative for vision changes.  ENT: Negative for hoarseness, difficulty swallowing , nasal congestion. CV: Negative for chest pain, angina, palpitations, dyspnea on exertion, peripheral edema.  Respiratory: Negative for dyspnea at rest, dyspnea on exertion, cough, sputum, wheezing.  GI: See history of present illness. GU:  Negative for dysuria, hematuria, urinary incontinence, urinary frequency, nocturnal urination.  MS: Negative for joint pain, low back pain.  Derm: Negative for rash or itching.  Neuro: Negative for weakness, abnormal sensation, seizure, frequent headaches, memory loss, confusion.  Psych: Negative for   suicidal ideation, hallucinations. +anxiety/depression under control Endo: Negative for unusual weight change.  Heme: Negative for bruising or bleeding. Allergy: Negative for rash or hives.    Physical Examination:  BP 139/86   Pulse (!) 59   Temp (!) 97 F (36.1 C) (Oral)   Ht 5\' 2"  (1.575 m)   Wt 170 lb 12.8 oz (77.5 kg)   LMP 01/10/2013   BMI 31.24 kg/m    General: Well-nourished, well-developed in no acute distress.  Head: Normocephalic, atraumatic.   Eyes: Conjunctiva pink, no icterus. Mouth: Oropharyngeal mucosa moist and pink , no lesions erythema or exudate. Neck: Supple without thyromegaly, masses, or lymphadenopathy.  Lungs: Clear to auscultation bilaterally.  Heart: Regular  rate and rhythm, no murmurs rubs or gallops.  Abdomen: Bowel sounds are normal, nontender, nondistended, no hepatosplenomegaly or masses, no abdominal bruits or    hernia , no rebound or guarding.   Rectal: not performed Extremities: No lower extremity edema. No clubbing or deformities.  Neuro: Alert and oriented x 4 , grossly normal neurologically.  Skin: Warm and dry, no rash or jaundice.   Psych: Alert and cooperative, normal mood and affect.  Labs: Hep C ab positive 08/2017  Imaging Studies:  No results found.

## 2017-12-09 ENCOUNTER — Telehealth: Payer: Self-pay

## 2017-12-09 NOTE — Telephone Encounter (Signed)
Received some [employer] lab results that were ordered by PCP and done 08/22/2017. Placed in [place]' box since she saw pt on 12/04/2017.

## 2017-12-09 NOTE — Telephone Encounter (Signed)
No GI related labs. Trig/GC/Chlamydia all negative.

## 2017-12-09 NOTE — Progress Notes (Signed)
cc'd to pcp 

## 2017-12-10 ENCOUNTER — Ambulatory Visit (HOSPITAL_COMMUNITY): Payer: Medicaid Other

## 2018-02-04 ENCOUNTER — Telehealth: Payer: Self-pay

## 2018-02-04 NOTE — Telephone Encounter (Signed)
Received call from [place] at pre-service center. Pt is scheduled for [place] abd complete w/elastography 02/09/18 and needs PA. PA had been obtained but since expired d/t pt has rescheduled appt. Case submitted via [employer]. PA# [id number], 02/04/18-03/06/18. LMOVM for Enrique Sack.

## 2018-02-09 ENCOUNTER — Ambulatory Visit (HOSPITAL_COMMUNITY): Admission: RE | Admit: 2018-02-09 | Payer: Medicaid Other | Source: Ambulatory Visit

## 2018-03-30 ENCOUNTER — Telehealth: Payer: Self-pay | Admitting: Internal Medicine

## 2018-03-30 NOTE — Telephone Encounter (Signed)
US rescheduled to 04/13/18 at 9:30am, arrive at 9:15am. NPO after midnight before test. Letter mailed.

## 2018-03-30 NOTE — Telephone Encounter (Signed)
PATIENT CALLED AND NEEDS HER ULTRASOUND RESCHEDULED    [phone]  SAID SHE WILL BACK Monday AND FIND OUT IF IT HAS BEEN SCHEDULED.  SHE HAS NO PHONE MINUTES

## 2018-04-06 NOTE — Telephone Encounter (Signed)
PA for US abd complete w/elastography approved via [employer] website. PA# [id number], 04/06/18-05/06/18.

## 2018-04-13 ENCOUNTER — Ambulatory Visit (HOSPITAL_COMMUNITY)
Admission: RE | Admit: 2018-04-13 | Discharge: 2018-04-13 | Disposition: A | Discharge status: Home / Self Care | Payer: Medicaid Other | Source: Ambulatory Visit | Attending: Gastroenterology | Admitting: Gastroenterology

## 2018-04-13 DIAGNOSIS — B182 Chronic viral hepatitis C: Secondary | ICD-10-CM

## 2018-04-17 LAB — HCV FIBROSURE
ALPHA 2-MACROGLOBULINS, QN: 377 mg/dL — ABNORMAL HIGH (ref 110–276)
ALT (SGPT) P5P: 59 IU/L — ABNORMAL HIGH (ref 0–40)
APOLIPOPROTEIN A I: 141 mg/dL (ref 116–209)
BILIRUBIN, TOTAL: 0.2 mg/dL (ref 0.0–1.2)
Fibrosis Score: 0.38 — ABNORMAL HIGH (ref 0.00–0.21)
GGT: 154 IU/L — ABNORMAL HIGH (ref 0–60)
Haptoglobin: 149 mg/dL (ref 42–296)
Necroinflammat Activity Score: 0.38 — ABNORMAL HIGH (ref 0.00–0.17)

## 2018-04-17 LAB — CBC WITH DIFFERENTIAL/PLATELET
BASOS ABS: 0 10*3/uL (ref 0.0–0.2)
Basos: 0 %
EOS (ABSOLUTE): 0 10*3/uL (ref 0.0–0.4)
Eos: 1 %
Hematocrit: 44.8 % (ref 34.0–46.6)
Hemoglobin: 16.2 g/dL — ABNORMAL HIGH (ref 11.1–15.9)
Immature Grans (Abs): 0 10*3/uL (ref 0.0–0.1)
Immature Granulocytes: 0 %
Lymphocytes Absolute: 2.5 10*3/uL (ref 0.7–3.1)
Lymphs: 34 %
MCH: 34.5 pg — ABNORMAL HIGH (ref 26.6–33.0)
MCHC: 36.2 g/dL — ABNORMAL HIGH (ref 31.5–35.7)
MCV: 95 fL (ref 79–97)
Monocytes Absolute: 0.6 10*3/uL (ref 0.1–0.9)
Monocytes: 8 %
NEUTROS ABS: 4.3 10*3/uL (ref 1.4–7.0)
NEUTROS PCT: 57 %
Platelets: 328 10*3/uL (ref 150–450)
RBC: 4.7 x10E6/uL (ref 3.77–5.28)
RDW: 12.3 % (ref 11.7–15.4)
WBC: 7.5 10*3/uL (ref 3.4–10.8)

## 2018-04-17 LAB — COMPREHENSIVE METABOLIC PANEL
A/G RATIO: 1.5 (ref 1.2–2.2)
ALT: 50 IU/L — ABNORMAL HIGH (ref 0–32)
AST: 38 IU/L (ref 0–40)
Albumin: 4.5 g/dL (ref 3.5–5.5)
Alkaline Phosphatase: 86 IU/L (ref 39–117)
BUN/Creatinine Ratio: 13 (ref 9–23)
BUN: 11 mg/dL (ref 6–24)
Bilirubin Total: 0.3 mg/dL (ref 0.0–1.2)
CO2: 24 mmol/L (ref 20–29)
Calcium: 9.9 mg/dL (ref 8.7–10.2)
Chloride: 102 mmol/L (ref 96–106)
Creatinine, Ser: 0.85 mg/dL (ref 0.57–1.00)
GFR calc Af Amer: 97 mL/min/{1.73_m2} (ref >59)
GFR calc non Af Amer: 84 mL/min/{1.73_m2} (ref >59)
Globulin, Total: 3 g/dL (ref 1.5–4.5)
Glucose: 96 mg/dL (ref 65–99)
POTASSIUM: 4.6 mmol/L (ref 3.5–5.2)
SODIUM: 141 mmol/L (ref 134–144)
Total Protein: 7.5 g/dL (ref 6.0–8.5)

## 2018-04-17 LAB — HEPATITIS B SURFACE ANTIGEN: HEP B S AG: NEGATIVE

## 2018-04-17 LAB — HEPATITIS B CORE ANTIBODY, TOTAL: HEP B C TOTAL AB: POSITIVE — AB

## 2018-04-17 LAB — HCV RNA QUANT RFLX ULTRA OR GENOTYP
HCV Quant Baseline: 4040000 IU/mL
HCV log10: 6.606 log10 IU/mL

## 2018-04-17 LAB — HEPATITIS C GENOTYPE

## 2018-04-17 LAB — HEPATITIS A ANTIBODY, TOTAL: HEP A TOTAL AB: NEGATIVE

## 2018-04-17 LAB — HEPATITIS B SURFACE ANTIBODY, QUANTITATIVE: HEPATITIS B SURF AB QUANT: 870.7 m[IU]/mL

## 2018-04-17 LAB — HIV ANTIBODY (ROUTINE TESTING W REFLEX): HIV SCREEN 4TH GENERATION: NONREACTIVE

## 2018-04-23 ENCOUNTER — Telehealth: Payer: Self-pay | Admitting: Internal Medicine

## 2018-04-23 NOTE — Telephone Encounter (Signed)
Noted. Waiting on pts call back.

## 2018-04-23 NOTE — Telephone Encounter (Signed)
Pt was calling to get her lab and [place] results and to set up OV. I made her OV for 3/4 at 1130 with LSL. LSL notes says to make it with her in 8 weeks. Pt is aware of OV. She said she doesn't have a phone for the nurse to call her. I asked couldn't the nurse call her on the phone she was calling from. Patient said not to that she would just call back. [phone]

## 2018-04-28 NOTE — Telephone Encounter (Signed)
This was addressed 04/19/2018, see lab result note. Not sure if it was ever routed. Please let patient know lab and u/s results and plan. Thanks.

## 2018-04-28 NOTE — Telephone Encounter (Signed)
Pt is inquiring about u/s results on 04/13/18.

## 2018-04-29 NOTE — Telephone Encounter (Signed)
Lmom, waiting on a return call.  

## 2018-05-25 ENCOUNTER — Telehealth: Payer: Self-pay

## 2018-05-25 NOTE — Telephone Encounter (Signed)
Lmom, waiting on a return call. Will discuss Hep C paper work with pt.

## 2018-06-10 ENCOUNTER — Telehealth: Payer: Self-pay | Admitting: Gastroenterology

## 2018-06-10 ENCOUNTER — Ambulatory Visit (INDEPENDENT_AMBULATORY_CARE_PROVIDER_SITE_OTHER): Payer: Medicaid Other | Admitting: Gastroenterology

## 2018-06-10 ENCOUNTER — Encounter: Payer: Self-pay | Admitting: Gastroenterology

## 2018-06-10 ENCOUNTER — Encounter: Payer: Self-pay | Admitting: General Practice

## 2018-06-10 VITALS — BP 151/89 | HR 62 | Temp 97.3°F | Ht 62.0 in | Wt 174.4 lb

## 2018-06-10 DIAGNOSIS — B182 Chronic viral hepatitis C: Secondary | ICD-10-CM | POA: Diagnosis not present

## 2018-06-10 NOTE — Telephone Encounter (Signed)
Patient seen in office today for Hep C. We have had issues reaching patient at times because she will have months without minutes on her phone due to financial hardships.   She requests that we not send any letters to her address. Can we document that someway so we don't inadvertently send her a letter per her request?

## 2018-06-10 NOTE — Telephone Encounter (Signed)
Will do!

## 2018-06-10 NOTE — Assessment & Plan Note (Signed)
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 Once HCV treated, would advise pursuing Hep A vaccination. She is immune to Hep B. Will start approval process for Epclusa. She is aware that it would take about two weeks until we have medication delivered here. She will come in for medication instructions at time of medication pick up. If she loses access to her phone, she should call us in two weeks. She will continue to avoid etoh and IV/intravasal drug use. We discussed need for medication compliance and keeping up with labs etc.   PATIENT DOES NOT WANT ANYONE TO BE GIVEN ANY OF HER MEDICAL INFORMATION AND DOES NOT WANT ANY LETTERS TO BE SENT TO HER ADDRESS.

## 2018-06-10 NOTE — Patient Instructions (Signed)
We will send information to the pharmacy today for [facility] approval for treatment of Hepatitis C. It usually takes about two weeks to have medication available in our office for your to start. If you have not heard from [place] (do to you not having minutes on your phone), please call in 2 weeks.  Once you start Epclusa, you will need to notify us if you have any new medications started, prescription or over the counter to make sure none interfere with Epclusa.  We will give you more specific information about how to take Epclusa when you come to pick medication up in the near future.

## 2018-06-10 NOTE — Progress Notes (Signed)
Primary Care Physician: Kirstie Peri, MD  Primary Gastroenterologist:  Roetta Sessions, MD   Chief Complaint  Patient presents with  . Hepatitis C    HPI: Amy Cummings is a 43 y.o. female here for follow up of Hepatitis C. Initially seen in 11/2017. She has never been treated. First diagnosed nearly 20 years ago. Prior history of IV drug use. Labs showed HCV RNA 4 million IU/mL, genotype 1b. U/S with F0/F1. Fibrosure F1-F2. She is immune to Hep B. She is not immune to Hep A.   Presents today ready to start Hep C treatment. We have been unable to process her speciality pharmacy request for Epclusa due to lack of insurance information. She has been difficult to get in touch with by phone, she reports not being able to keep it on all the time due to finances.   SHE REQUEST THAT WE NOT SEND IN LETTERS TO HER REGARDING HEPATITIS C.   She has been advised that she should check in with [place] if she loses access to her phone as we will need to be able to notify her of medication shipments etc.    Clinically she has been doing ok. She has some pp ruq pain that lasts seconds or minutes at a time and is relieved with apply pressure. No n/v. Gallbladder unremarkable on recent u/s. No heartburn. BM regular. No melena, brbpr.   Current Outpatient Medications  Medication Sig Dispense Refill  . albuterol (PROVENTIL HFA;VENTOLIN HFA) 108 (90 Base) MCG/ACT inhaler Inhale into the lungs every 6 (six) hours as needed for wheezing or shortness of breath.    Marland Kitchen albuterol (PROVENTIL) (2.5 MG/3ML) 0.083% nebulizer solution Take 3 mLs (2.5 mg total) by nebulization 4 (four) times daily as needed for wheezing or shortness of breath. 75 mL 12  . alprazolam (XANAX) 2 MG tablet Take 2 mg by mouth 3 (three) times daily.  0  . lamoTRIgine (LAMICTAL) 200 MG tablet Take 200 mg by mouth daily.  2  . REXULTI 2 MG TABS Take 1 tablet by mouth daily.  2  . traZODone (DESYREL) 100 MG tablet Take 100 mg by mouth as needed.   2   No current facility-administered medications for this visit.     Allergies as of 06/10/2018 - Review Complete 06/10/2018  Allergen Reaction Noted  . Geodon [ziprasidone hydrochloride] Other (See Comments) 10/23/2010    ROS:  General: Negative for anorexia, weight loss, fever, chills, fatigue, weakness. ENT: Negative for hoarseness, difficulty swallowing , nasal congestion. CV: Negative for chest pain, angina, palpitations, dyspnea on exertion, peripheral edema.  Respiratory: Negative for dyspnea at rest, dyspnea on exertion, cough, sputum, wheezing.  GI: See history of present illness. GU:  Negative for dysuria, hematuria, urinary incontinence, urinary frequency, nocturnal urination.  Endo: Negative for unusual weight change.    Physical Examination:   BP (!) 151/89   Pulse 62   Temp (!) 97.3 F (36.3 C) (Oral)   Ht 5\' 2"  (1.575 m)   Wt 174 lb 6.4 oz (79.1 kg)   LMP 01/10/2013   BMI 31.90 kg/m   General: Well-nourished, well-developed in no acute distress.  Eyes: No icterus. Mouth: Oropharyngeal mucosa moist and pink , no lesions erythema or exudate. Lungs: Clear to auscultation bilaterally.  Heart: Regular rate and rhythm, no murmurs rubs or gallops.  Abdomen: Bowel sounds are normal, nontender, nondistended, no hepatosplenomegaly or masses, no abdominal bruits or hernia , no rebound or guarding.   Extremities: No  lower extremity edema. No clubbing or deformities. Neuro: Alert and oriented x 4   Skin: Warm and dry, no jaundice.   Psych: Alert and cooperative, normal mood and affect.  Labs:  Lab Results  Component Value Date   CREATININE 0.85 04/13/2018   BUN 11 04/13/2018   NA 141 04/13/2018   K 4.6 04/13/2018   CL 102 04/13/2018   CO2 24 04/13/2018   Lab Results  Component Value Date   ALT 50 (H) 04/13/2018   AST 38 04/13/2018   ALKPHOS 86 04/13/2018   BILITOT 0.3 04/13/2018   Lab Results  Component Value Date   WBC 7.5 04/13/2018   HGB 16.2 (H)  04/13/2018   HCT 44.8 04/13/2018   MCV 95 04/13/2018   PLT 328 04/13/2018   No results found for: INR, PROTIME Lab Results  Component Value Date   HAV Negative 04/13/2018   HEPBCAB Positive (A) 04/13/2018    Imaging Studies: No results found.

## 2018-06-11 NOTE — Progress Notes (Signed)
cc'd to pcp 

## 2018-06-11 NOTE — Telephone Encounter (Signed)
E-mailed Carolyne Littles to find out if this was a possibility

## 2018-06-11 NOTE — Telephone Encounter (Signed)
Routing to Stacey  

## 2018-07-03 NOTE — Telephone Encounter (Signed)
Received verification letter from [facility]. Pt will have a $3.00 copay for Hep C medication. Spoke with pt earlier this week when pt needed to sign a form for [facility]. Per pt she will contact me next week. Pt doesn't want any letters mailed or for me to call her.

## 2018-07-06 ENCOUNTER — Telehealth: Payer: Self-pay | Admitting: Internal Medicine

## 2018-07-06 NOTE — Telephone Encounter (Signed)
Med list fax over to [facility].

## 2018-07-06 NOTE — Telephone Encounter (Signed)
[facility] called asking for patient's med list to be faxed over to them at [phone]

## 2018-07-15 ENCOUNTER — Telehealth: Payer: Self-pay

## 2018-07-15 NOTE — Telephone Encounter (Signed)
Received a call from [employer] rep. They were calling because they haven't been able to get in touch with pt. Pt is hard to get in touch with. [facility] has to go over information and get approval to ship medication to our office from pt I tried calling pt and wasn't able to leave a VM per pts request. Pt's copay will be $3.

## 2018-07-24 NOTE — Telephone Encounter (Signed)
[facility] has placed pts RX on hold for now. They haven't been able to get in touch with pt. I've tried several times and can't reach pt. Pt was suppose to call me back 2 weeks ago. When pt returns call, she has to be ready to talk with [facility] because they wont schedule delivery unless they hear from our office.

## 2018-07-27 NOTE — Telephone Encounter (Signed)
Received a call from [facility] from [facility]. pts Hep C medication is scheduled for deliver by noon Wednesday 07/29/18.

## 2018-07-27 NOTE — Telephone Encounter (Signed)
Received a call from pts. She received a letter from [facility] stating they tried to contact her several times to get her medication delivery to our office. Pt has a new number # [phone]. Number was updated in her chart as her home number per pts request.

## 2018-07-29 NOTE — Telephone Encounter (Signed)
Pts Epclusa medication has arrived 07/30/2018. Please advise.

## 2018-08-06 NOTE — Telephone Encounter (Signed)
Please have patient complete labs after four week of treatment and schedule four week virtual OV.   Labs: HCV RNA quantitative, CBC, CMET.

## 2018-08-06 NOTE — Telephone Encounter (Signed)
Epclusa for Hepatitis C Instructions:   1. Take Epclusa, one tablet daily with or without food. You should take it at approximately the same time every day.  Do not miss a dose. You will complete 12 weeks of treatment.    2. Do not run out of Epclusa! If you are down to one week of medication left and have not heard about your next shipment, please let us know as soon as possible.   3. If you need to start a new medication, prescription from your doctor or over the counter medication, you need to contact us to make sure it does not interfere with Epclusa. There are several medications that can interfere with Epclusa and can make you sick or make the medication not work.  4. If you need to take a medication for acid reflux, your choices are as follows: 1. TUMS or Rolaids separated at least four hours from [place].  2. Pepcid (famotidine) 40mg  up to twice per day administered with Epclusa or 12 hours apart from [place].    5. Tylenol (acetominophen) and Advil (ibuprofen) are safe to take with Epclusa if needed for headache, fever, pain.   6. You will have an appointment and blood work done after 4 weeks of treatment to make sure you are tolerating Epclusa and to see if the medication is getting rid of the Hepatitis C.   7. DO NOT stop Epclusa unless instructed to by your provider.  8. You will also have blood work done at the end of treatment and 12-24 weeks after end of treatment.        Patient signature: ____________________  Date: _______________

## 2018-08-10 ENCOUNTER — Other Ambulatory Visit: Payer: Self-pay

## 2018-08-10 DIAGNOSIS — B182 Chronic viral hepatitis C: Secondary | ICD-10-CM

## 2018-08-10 NOTE — Telephone Encounter (Signed)
Tried calling pt. VM was available, phone continued to ring.

## 2018-08-10 NOTE — Telephone Encounter (Signed)
Pt returned call. Pt will call in the morning to let me know when she is on the way to sign and pickup medication. Lab orders placed.

## 2018-08-12 NOTE — Telephone Encounter (Signed)
Pt picked medication up on 08/11/2018. Pt was instructed on medication and instructions per LSL.

## 2018-09-08 ENCOUNTER — Telehealth: Payer: Self-pay | Admitting: Internal Medicine

## 2018-09-08 NOTE — Telephone Encounter (Signed)
Spoke with pt, she is aware that she needs to go to [facility] for lab work. Pt hasn't called to schedule her second delivery of medication. Will discuss with [facility].

## 2018-09-08 NOTE — Telephone Encounter (Signed)
[phone] (SHE WILL BE AT WORK AFTER 10)- SAID SHE IS DOWN TO 2 PILLS FOR HER HEP-C AND HAS NOT GOTTEN HER LABS YET, STATED SHE HAS BEEN "REAL BUSY".  SAID TO PLEASE CALL HER AND LET HER KNOW IF SHE NEEDS TO GO TODAY AND HAVE HER LABWORK.

## 2018-09-08 NOTE — Telephone Encounter (Signed)
Spoke with [facility]. They've been trying to reach pt. Pt has to call them to discuss her shipment of her medication.

## 2018-09-09 ENCOUNTER — Telehealth: Payer: Self-pay | Admitting: Internal Medicine

## 2018-09-09 LAB — CBC WITH DIFFERENTIAL/PLATELET
Basophils Absolute: 0 10*3/uL (ref 0.0–0.2)
Basos: 0 %
EOS (ABSOLUTE): 0.1 10*3/uL (ref 0.0–0.4)
Eos: 1 %
Hematocrit: 45.1 % (ref 34.0–46.6)
Hemoglobin: 16 g/dL — ABNORMAL HIGH (ref 11.1–15.9)
Immature Grans (Abs): 0 10*3/uL (ref 0.0–0.1)
Immature Granulocytes: 0 %
Lymphocytes Absolute: 4.4 10*3/uL — ABNORMAL HIGH (ref 0.7–3.1)
Lymphs: 42 %
MCH: 33.9 pg — ABNORMAL HIGH (ref 26.6–33.0)
MCHC: 35.5 g/dL (ref 31.5–35.7)
MCV: 96 fL (ref 79–97)
Monocytes Absolute: 0.9 10*3/uL (ref 0.1–0.9)
Monocytes: 9 %
Neutrophils Absolute: 5 10*3/uL (ref 1.4–7.0)
Neutrophils: 48 %
Platelets: 311 10*3/uL (ref 150–450)
RBC: 4.72 x10E6/uL (ref 3.77–5.28)
RDW: 11.9 % (ref 11.7–15.4)
WBC: 10.4 10*3/uL (ref 3.4–10.8)

## 2018-09-09 LAB — COMPREHENSIVE METABOLIC PANEL
ALT: 14 IU/L (ref 0–32)
AST: 18 IU/L (ref 0–40)
Albumin/Globulin Ratio: 1.7 (ref 1.2–2.2)
Albumin: 4.8 g/dL (ref 3.8–4.8)
Alkaline Phosphatase: 67 IU/L (ref 39–117)
BUN/Creatinine Ratio: 12 (ref 9–23)
BUN: 10 mg/dL (ref 6–24)
Bilirubin Total: 0.3 mg/dL (ref 0.0–1.2)
CO2: 22 mmol/L (ref 20–29)
Calcium: 9.6 mg/dL (ref 8.7–10.2)
Chloride: 102 mmol/L (ref 96–106)
Creatinine, Ser: 0.84 mg/dL (ref 0.57–1.00)
GFR calc Af Amer: 98 mL/min/{1.73_m2} (ref >59)
GFR calc non Af Amer: 85 mL/min/{1.73_m2} (ref >59)
Globulin, Total: 2.9 g/dL (ref 1.5–4.5)
Glucose: 78 mg/dL (ref 65–99)
Potassium: 4.1 mmol/L (ref 3.5–5.2)
Sodium: 139 mmol/L (ref 134–144)
Total Protein: 7.7 g/dL (ref 6.0–8.5)

## 2018-09-09 NOTE — Telephone Encounter (Signed)
Tried calling pt x 2. Phone continued to ring.

## 2018-09-09 NOTE — Telephone Encounter (Signed)
Tried calling pt with no answer. Will call pt back.

## 2018-09-09 NOTE — Telephone Encounter (Signed)
PATIENT CALLED TO SAY THAT SHE WENT AND HAD HER LABS AND SHE IS OUT OF MEDICATION, WANTED TO KNOW IF THEY CAN BE SHIPPED OVERNIGHT...PLEASE CALL

## 2018-09-10 NOTE — Telephone Encounter (Signed)
Pt returned call and is aware that [facility] needs to hear from her in order to schedule delivery at our office. Pt was given the number for [facility] and asked to call them.    LSL. Wanted to update you on pt. There is another phone note with documentation. Pt didn't call [facility] to schedule her delivery of medication. I called [facility] and they needed to speak with pt. Pt has been hard to reach all week and doesn't return calls until the following day. She ran out of her medication today. As soon as she calls [facility] they can overnight her medication.

## 2018-09-10 NOTE — Telephone Encounter (Addendum)
Will this be her third shipment? Can it be delivered to her house in case it doesn't come before we close tomorrow?  See result note, ?HCV RNA missed?

## 2018-09-10 NOTE — Telephone Encounter (Signed)
Pt doesn't want any shipments sent to her home. LSL is aware of this. [facility] is suppose to have hep c shippment at our office before 12 PM tomorrow. Tried calling pt. No answer. Will call when medication arrives. [facility] was also going to try to reach pt.

## 2018-09-11 LAB — HCV RNA QUANT: Hepatitis C Quantitation: NOT DETECTED IU/mL

## 2018-09-14 NOTE — Progress Notes (Signed)
Called, many rings and no answer.

## 2018-10-15 ENCOUNTER — Telehealth: Payer: Self-pay | Admitting: Internal Medicine

## 2018-10-15 NOTE — Telephone Encounter (Signed)
Tried calling pt with no answer. No machine. Will call pt back.

## 2018-10-15 NOTE — Telephone Encounter (Signed)
Patient called and said that she finished her medication and wants to know what she is supposed to do now.

## 2018-10-16 NOTE — Telephone Encounter (Signed)
Tried calling pt. No answer. Not able to leave a message.

## 2018-10-26 NOTE — Telephone Encounter (Signed)
Patient is at risk of relapse if she does not complete medication as prescribed.   Can we let everyone know that if this patient calls in that she should not be sent to voicemail etc, she needs to speak to one of nurses regarding HCV.

## 2018-10-26 NOTE — Telephone Encounter (Signed)
Called pt again with no answer.  No VM is on pt's phone. Pt doesn't want letters or VM's for her. Spoke with [facility] and pt is due one one more shipment. [facility] has tried to reach pt several times and I've called several times. Pt is out of her medication and this is suppose to be her last shipment of medication.

## 2018-10-26 NOTE — Telephone Encounter (Signed)
Noted  

## 2018-10-26 NOTE — Telephone Encounter (Signed)
Noted. Please see LSL, note below. Please don't send pt to VM when she calls. Pt needs to complete 4 more weeks of Hep C medication and is at risk for relapse . Please have pt speak to me. [facility] is trying to schedule delivery and pt needs to talk with myself and [facility].

## 2018-10-27 ENCOUNTER — Other Ambulatory Visit: Payer: Self-pay

## 2018-10-27 NOTE — Telephone Encounter (Signed)
I spoke with Roosevelt Locks, NP with [facility]. She advised finish Epclusa. Repeat HCV RNA now. If her viral load is high than we will know she relapsed and will need to stop med and wait 3-6 months before retreating with another 12 weeks of Epclusa.   Elmo Putt,  At this point, she needs last bottle of Epclusa ASAP. Finish full 12 weeks.   Repeat HCV RNA now.

## 2018-10-27 NOTE — Telephone Encounter (Signed)
Pt returned call this morning 10/27/2018. Pt was advised to call [facility] to discuss her next shipment.   [facility] called to schedule a delivery, they also transferred me to a pharmacist to see if it was ok for pt to take the last bottle since she's been off of her medication for a couple weeks.   LSL, the pharmacist wants to know if you're ok with them shipping the last bottle out to pt. Per the pharmacist 2 weeks can be a long time off med and they can send it per your approval. The medication is going to require that they do another PA for Epclusa. Another medication can be tried for 12 weeks if you want pt to restart a new medication.

## 2018-10-27 NOTE — Telephone Encounter (Signed)
Tried calling pt, pt isn't answering the phone. No VM available. [facility] is scheduling delivery for Hep C med tomorrow. Pt needs to have lab work now per LSL.

## 2018-10-27 NOTE — Telephone Encounter (Signed)
Tried calling pt with no answer. 

## 2018-10-28 NOTE — Telephone Encounter (Signed)
Tried calling pt, no VM available, not able to leave a message. Pt's medication arrived today.

## 2018-10-29 NOTE — Telephone Encounter (Signed)
Correction. Tried calling pt, no answer, no VM.

## 2018-10-29 NOTE — Telephone Encounter (Signed)
Tried calling pt this morning

## 2018-11-02 NOTE — Telephone Encounter (Signed)
Tried calling pt with no answer, not able to reach pt.

## 2018-11-02 NOTE — Telephone Encounter (Signed)
FYI, pt's medication arrived last week. I've called pt several time with no return call. No VM is set up. Pt needs to complete lab work as well.

## 2018-11-09 ENCOUNTER — Telehealth: Payer: Self-pay | Admitting: Internal Medicine

## 2018-11-09 NOTE — Telephone Encounter (Signed)
Noted  

## 2018-11-09 NOTE — Telephone Encounter (Signed)
patient called with a question about her medication refill but said she would have to call us back-

## 2018-11-10 NOTE — Telephone Encounter (Signed)
Noted. Tried calling pt this morning. Pt's phone continued to ring.

## 2018-11-10 NOTE — Telephone Encounter (Signed)
I see patient called yesterday, see different phone note. Can we please find out how long she has been off medication?   Needs to finish last bottle and HCV RNA now.

## 2018-11-18 ENCOUNTER — Other Ambulatory Visit: Payer: Self-pay

## 2018-11-18 DIAGNOSIS — Z8619 Personal history of other infectious and parasitic diseases: Secondary | ICD-10-CM

## 2018-11-18 NOTE — Telephone Encounter (Signed)
noted 

## 2018-11-18 NOTE — Telephone Encounter (Signed)
FYI Pt called @ 9:15 AM this morning. She is on her way to get her lab orders and medication.

## 2018-11-18 NOTE — Addendum Note (Signed)
Addended by: Derrick Ravel on: 11/07/6551 74:82 AM   Modules accepted: Orders

## 2018-11-20 LAB — HCV RNA QUANT: Hepatitis C Quantitation: NOT DETECTED IU/mL

## 2018-11-23 ENCOUNTER — Other Ambulatory Visit: Payer: Self-pay

## 2018-11-23 DIAGNOSIS — Z8619 Personal history of other infectious and parasitic diseases: Secondary | ICD-10-CM

## 2018-12-07 ENCOUNTER — Other Ambulatory Visit (HOSPITAL_COMMUNITY): Payer: Self-pay | Admitting: Respiratory Therapy

## 2018-12-07 DIAGNOSIS — R0602 Shortness of breath: Secondary | ICD-10-CM

## 2018-12-11 ENCOUNTER — Inpatient Hospital Stay (HOSPITAL_COMMUNITY): Admission: RE | Admit: 2018-12-11 | Payer: Medicaid Other | Source: Ambulatory Visit

## 2019-02-25 ENCOUNTER — Other Ambulatory Visit: Payer: Self-pay

## 2019-02-25 DIAGNOSIS — Z8619 Personal history of other infectious and parasitic diseases: Secondary | ICD-10-CM

## 2019-03-23 ENCOUNTER — Telehealth: Payer: Self-pay | Admitting: Internal Medicine

## 2019-03-23 ENCOUNTER — Encounter: Payer: Self-pay | Admitting: Internal Medicine

## 2019-03-23 ENCOUNTER — Ambulatory Visit: Payer: Medicaid Other | Admitting: Gastroenterology

## 2019-03-23 NOTE — Telephone Encounter (Signed)
PATIENT WAS A NO SHOW AND LETTER SENT  °

## 2019-03-24 ENCOUNTER — Emergency Department (HOSPITAL_COMMUNITY)
Admission: EM | Admit: 2019-03-24 | Discharge: 2019-03-25 | Disposition: A | Discharge status: Home / Self Care | Payer: Medicaid Other | Attending: Emergency Medicine | Admitting: Emergency Medicine

## 2019-03-24 ENCOUNTER — Other Ambulatory Visit: Payer: Self-pay

## 2019-03-24 ENCOUNTER — Encounter (HOSPITAL_COMMUNITY): Payer: Self-pay

## 2019-03-24 DIAGNOSIS — F319 Bipolar disorder, unspecified: Secondary | ICD-10-CM | POA: Diagnosis not present

## 2019-03-24 DIAGNOSIS — Z20828 Contact with and (suspected) exposure to other viral communicable diseases: Secondary | ICD-10-CM | POA: Diagnosis not present

## 2019-03-24 DIAGNOSIS — Y92009 Unspecified place in unspecified non-institutional (private) residence as the place of occurrence of the external cause: Secondary | ICD-10-CM | POA: Insufficient documentation

## 2019-03-24 DIAGNOSIS — T1491XA Suicide attempt, initial encounter: Secondary | ICD-10-CM | POA: Insufficient documentation

## 2019-03-24 DIAGNOSIS — R4689 Other symptoms and signs involving appearance and behavior: Secondary | ICD-10-CM

## 2019-03-24 DIAGNOSIS — F17210 Nicotine dependence, cigarettes, uncomplicated: Secondary | ICD-10-CM | POA: Diagnosis not present

## 2019-03-24 DIAGNOSIS — F10920 Alcohol use, unspecified with intoxication, uncomplicated: Secondary | ICD-10-CM | POA: Insufficient documentation

## 2019-03-24 DIAGNOSIS — Z046 Encounter for general psychiatric examination, requested by authority: Secondary | ICD-10-CM | POA: Diagnosis present

## 2019-03-24 DIAGNOSIS — Y939 Activity, unspecified: Secondary | ICD-10-CM | POA: Insufficient documentation

## 2019-03-24 DIAGNOSIS — F1410 Cocaine abuse, uncomplicated: Secondary | ICD-10-CM | POA: Diagnosis not present

## 2019-03-24 DIAGNOSIS — Y999 Unspecified external cause status: Secondary | ICD-10-CM | POA: Insufficient documentation

## 2019-03-24 DIAGNOSIS — X781XXA Intentional self-harm by knife, initial encounter: Secondary | ICD-10-CM | POA: Insufficient documentation

## 2019-03-24 DIAGNOSIS — F1210 Cannabis abuse, uncomplicated: Secondary | ICD-10-CM | POA: Diagnosis not present

## 2019-03-24 DIAGNOSIS — Z79899 Other long term (current) drug therapy: Secondary | ICD-10-CM | POA: Diagnosis not present

## 2019-03-24 DIAGNOSIS — F1910 Other psychoactive substance abuse, uncomplicated: Secondary | ICD-10-CM
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
## 2019-03-25 LAB — COMPREHENSIVE METABOLIC PANEL
ALT: 19 U/L (ref 0–44)
AST: 34 U/L (ref 15–41)
Albumin: 4.6 g/dL (ref 3.5–5.0)
Alkaline Phosphatase: 57 U/L (ref 38–126)
Anion gap: 14 (ref 5–15)
BUN: 9 mg/dL (ref 6–20)
CO2: 21 mmol/L — ABNORMAL LOW (ref 22–32)
Calcium: 9.1 mg/dL (ref 8.9–10.3)
Chloride: 106 mmol/L (ref 98–111)
Creatinine, Ser: 0.78 mg/dL (ref 0.44–1.00)
GFR calc Af Amer: >60 mL/min (ref >60)
GFR calc non Af Amer: >60 mL/min (ref >60)
Glucose, Bld: 81 mg/dL (ref 70–99)
Potassium: 3 mmol/L — ABNORMAL LOW (ref 3.5–5.1)
Sodium: 141 mmol/L (ref 135–145)
Total Bilirubin: 0.6 mg/dL (ref 0.3–1.2)
Total Protein: 8 g/dL (ref 6.5–8.1)

## 2019-03-25 LAB — CBC WITH DIFFERENTIAL/PLATELET
Abs Immature Granulocytes: 0.03 10*3/uL (ref 0.00–0.07)
Basophils Absolute: 0 10*3/uL (ref 0.0–0.1)
Basophils Relative: 0 %
Eosinophils Absolute: 0 10*3/uL (ref 0.0–0.5)
Eosinophils Relative: 0 %
HCT: 45.6 % (ref 36.0–46.0)
Hemoglobin: 15.8 g/dL — ABNORMAL HIGH (ref 12.0–15.0)
Immature Granulocytes: 0 %
Lymphocytes Relative: 31 %
Lymphs Abs: 3.7 10*3/uL (ref 0.7–4.0)
MCH: 33.5 pg (ref 26.0–34.0)
MCHC: 34.6 g/dL (ref 30.0–36.0)
MCV: 96.6 fL (ref 80.0–100.0)
Monocytes Absolute: 1 10*3/uL (ref 0.1–1.0)
Monocytes Relative: 9 %
Neutro Abs: 7.1 10*3/uL (ref 1.7–7.7)
Neutrophils Relative %: 60 %
Platelets: 297 10*3/uL (ref 150–400)
RBC: 4.72 MIL/uL (ref 3.87–5.11)
RDW: 12.5 % (ref 11.5–15.5)
WBC: 11.9 10*3/uL — ABNORMAL HIGH (ref 4.0–10.5)
nRBC: 0 % (ref 0.0–0.2)

## 2019-03-25 LAB — RAPID URINE DRUG SCREEN, HOSP PERFORMED
Amphetamines: NOT DETECTED
Barbiturates: NOT DETECTED
Benzodiazepines: POSITIVE — AB
Cocaine: POSITIVE — AB
Opiates: NOT DETECTED
Tetrahydrocannabinol: POSITIVE — AB

## 2019-03-25 LAB — ACETAMINOPHEN LEVEL: Acetaminophen (Tylenol), Serum: <10 ug/mL — ABNORMAL LOW (ref 10–30)

## 2019-03-25 LAB — SALICYLATE LEVEL: Salicylate Lvl: <7 mg/dL (ref 2.8–30.0)

## 2019-03-25 LAB — HCG, QUANTITATIVE, PREGNANCY: hCG, Beta Chain, Quant, S: 3 m[IU]/mL (ref <5)

## 2019-03-25 LAB — ETHANOL: Alcohol, Ethyl (B): 100 mg/dL — ABNORMAL HIGH (ref <10)

## 2019-03-25 MED ORDER — DIPHENHYDRAMINE HCL 50 MG/ML IJ SOLN
50.0000 mg | Freq: Once | INTRAMUSCULAR | Status: AC
  Administered 2019-03-25: 50 mg via INTRAMUSCULAR
  Filled 2019-03-25: qty 1

## 2019-03-25 MED ORDER — HALOPERIDOL LACTATE 5 MG/ML IJ SOLN
5.0000 mg | Freq: Once | INTRAMUSCULAR | Status: AC
  Administered 2019-03-25: 5 mg via INTRAMUSCULAR
  Filled 2019-03-25: qty 1

## 2019-03-25 MED ORDER — TETANUS-DIPHTH-ACELL PERTUSSIS 5-2.5-18.5 LF-MCG/0.5 IM SUSP: 0.5000 mL | Freq: Once | INTRAMUSCULAR | Status: DC

## 2019-03-25 MED ORDER — POTASSIUM CHLORIDE CRYS ER 20 MEQ PO TBCR: 40.0000 meq | EXTENDED_RELEASE_TABLET | Freq: Once | ORAL | Status: DC

## 2019-03-25 MED ORDER — LORAZEPAM 2 MG/ML IJ SOLN
2.0000 mg | Freq: Once | INTRAMUSCULAR | Status: AC
  Administered 2019-03-25: 2 mg via INTRAMUSCULAR
  Filled 2019-03-25: qty 1

## 2019-03-25 MED ORDER — LORAZEPAM 1 MG PO TABS: 1.0000 mg | ORAL_TABLET | Freq: Four times a day (QID) | ORAL | Status: DC | PRN

## 2019-03-25 MED ORDER — DIPHENHYDRAMINE HCL 50 MG/ML IJ SOLN: 25.0000 mg | Freq: Once | INTRAMUSCULAR | Status: DC

## 2019-03-25 NOTE — ED Notes (Signed)
Pt asleep.

## 2019-03-25 NOTE — Discharge Instructions (Addendum)
Follow up as instructed by behavior health 
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
## 2019-03-25 NOTE — ED Notes (Signed)
Pt spitting in the floor. Trying to spit in [facility]

## 2019-03-25 NOTE — BH Assessment (Signed)
Prior to discharged TTS assessor and Mordecai Maes, NP, spoke with patient concerning safety and coping skills dealing with stress instead of cutting. Patient discussed getting and using a stress ball. Patient report she sees Dr. Micheal Likens for medication management (Bi-polar) reports her next appointment in two months. Tinnie Gens, NP, spoke with patient concerning visiting the ER if she feels like self-harming herself or calling her doctor office. Recommendation outpatient therapy as needed. Patient gave verbal permission for TTS assessor to call her boyfriend 'Terrell.' Report he works 7a - 7pm.  Patient denied suicidal / homicidal ideations, denied auditory / visual hallucinations   [place] - [place] [phone]) - attempted to call, no answer

## 2019-03-25 NOTE — [facility] Counselor (Signed)
Disposition:   Mordecai Maes, NP, patient is psych-cleared

## 2019-03-25 NOTE — ED Notes (Signed)
Unable to obtain EKG.

## 2019-03-25 NOTE — BH Assessment (Addendum)
Patient unable to be assessed. She is still sleeping and unable to be aroused due to the effects of medication.

## 2019-03-25 NOTE — ED Notes (Signed)
One bag of belongings placed on shelf in locker room

## 2019-03-25 NOTE — ED Notes (Signed)
Pt urinated all over pants pta. Unable to get sample at this time. Pt states she only did this because she was yelling so much
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
## 2019-03-25 NOTE — ED Notes (Signed)
Patient refused to sign DC. Patient accepted DC paper work and gave verbal acknowledgement of instructions.

## 2019-03-25 NOTE — ED Notes (Signed)
Pt changed into purple scrubs 

## 2019-03-25 NOTE — BH Assessment (Signed)
[place] Assessment Progress Note   Pt unable to be assessed at this time.  Pt given benadryl, haldol & ativan at 00:18.  TTS to see patient when she is alert and oriented.

## 2019-03-26 ENCOUNTER — Telehealth: Payer: Self-pay | Admitting: *Deleted

## 2019-03-26 LAB — SARS CORONAVIRUS 2 (TAT 6-24 HRS): SARS Coronavirus 2: NEGATIVE

## 2019-03-26 NOTE — Telephone Encounter (Signed)
Pt called for Covid lab results, not available at this time. Explained we would call her if it is positive. States I'll just call back in a little while. Fara Olden, RN-C

## 2019-08-23 ENCOUNTER — Other Ambulatory Visit: Payer: Self-pay

## 2019-08-23 ENCOUNTER — Other Ambulatory Visit (HOSPITAL_COMMUNITY)
Admission: RE | Admit: 2019-08-23 | Discharge: 2019-08-23 | Disposition: A | Discharge status: Home / Self Care | Payer: Medicaid Other | Source: Ambulatory Visit | Attending: Internal Medicine | Admitting: Internal Medicine

## 2019-08-23 DIAGNOSIS — Z20822 Contact with and (suspected) exposure to covid-19: Secondary | ICD-10-CM | POA: Diagnosis not present

## 2019-08-23 DIAGNOSIS — Z01812 Encounter for preprocedural laboratory examination: Secondary | ICD-10-CM | POA: Diagnosis not present

## 2019-08-23 LAB — SARS CORONAVIRUS 2 (TAT 6-24 HRS): SARS Coronavirus 2: NEGATIVE

## 2019-08-24 ENCOUNTER — Ambulatory Visit (HOSPITAL_COMMUNITY)
Admission: RE | Admit: 2019-08-24 | Discharge: 2019-08-24 | Disposition: A | Discharge status: Home / Self Care | Payer: Medicaid Other | Source: Ambulatory Visit | Attending: Internal Medicine | Admitting: Internal Medicine

## 2019-08-24 DIAGNOSIS — R0602 Shortness of breath: Secondary | ICD-10-CM | POA: Insufficient documentation

## 2019-08-24 LAB — PULMONARY FUNCTION TEST
DL/VA % pred: 97 %
DL/VA: 4.39 ml/min/mmHg/L
DLCO unc % pred: 102 %
DLCO unc: 19.92 ml/min/mmHg
FEF 25-75 Post: 3.48 L/sec
FEF 25-75 Pre: 1.92 L/sec
FEF2575-%Change-Post: 81 %
FEF2575-%Pred-Post: 123 %
FEF2575-%Pred-Pre: 67 %
FEV1-%Change-Post: 23 %
FEV1-%Pred-Post: 102 %
FEV1-%Pred-Pre: 83 %
FEV1-Post: 2.73 L
FEV1-Pre: 2.21 L
FEV1FVC-%Change-Post: 4 %
FEV1FVC-%Pred-Pre: 94 %
FEV6-%Change-Post: 18 %
FEV6-%Pred-Post: 105 %
FEV6-%Pred-Pre: 89 %
FEV6-Post: 3.4 L
FEV6-Pre: 2.87 L
FEV6FVC-%Pred-Post: 102 %
FEV6FVC-%Pred-Pre: 102 %
FVC-%Change-Post: 18 %
FVC-%Pred-Post: 103 %
FVC-%Pred-Pre: 87 %
FVC-Post: 3.4 L
FVC-Pre: 2.87 L
Post FEV1/FVC ratio: 80 %
Post FEV6/FVC ratio: 100 %
Pre FEV1/FVC ratio: 77 %
Pre FEV6/FVC Ratio: 100 %
RV % pred: 154 %
RV: 2.35 L
TLC % pred: 114 %
TLC: 5.25 L

## 2019-08-24 MED ORDER — ALBUTEROL SULFATE (2.5 MG/3ML) 0.083% IN NEBU
2.5000 mg | INHALATION_SOLUTION | Freq: Once | RESPIRATORY_TRACT | Status: AC
  Administered 2019-08-24: 2.5 mg via RESPIRATORY_TRACT

## 2019-10-04 ENCOUNTER — Ambulatory Visit: Payer: Medicaid Other | Admitting: Internal Medicine

## 2019-10-04 ENCOUNTER — Encounter: Payer: Self-pay | Admitting: Internal Medicine

## 2019-10-04 ENCOUNTER — Ambulatory Visit (INDEPENDENT_AMBULATORY_CARE_PROVIDER_SITE_OTHER): Payer: Medicaid Other

## 2019-10-04 ENCOUNTER — Other Ambulatory Visit: Payer: Self-pay

## 2019-10-04 VITALS — BP 132/82 | HR 67 | Temp 97.3°F | Ht 62.0 in | Wt 146.8 lb

## 2019-10-04 DIAGNOSIS — J4520 Mild intermittent asthma, uncomplicated: Secondary | ICD-10-CM

## 2019-10-04 DIAGNOSIS — J4541 Moderate persistent asthma with (acute) exacerbation: Secondary | ICD-10-CM | POA: Diagnosis not present

## 2019-10-04 DIAGNOSIS — J4531 Mild persistent asthma with (acute) exacerbation: Secondary | ICD-10-CM | POA: Diagnosis not present

## 2019-10-04 DIAGNOSIS — Z720 Tobacco use: Secondary | ICD-10-CM

## 2019-10-04 DIAGNOSIS — J45909 Unspecified asthma, uncomplicated: Secondary | ICD-10-CM | POA: Insufficient documentation

## 2019-10-04 MED ORDER — ALBUTEROL SULFATE HFA 108 (90 BASE) MCG/ACT IN AERS: 2.0000 | INHALATION_SPRAY | Freq: Four times a day (QID) | RESPIRATORY_TRACT | 12 refills | Status: AC | PRN

## 2019-10-04 MED ORDER — BREZTRI AEROSPHERE 160-9-4.8 MCG/ACT IN AERO: 2.0000 | INHALATION_SPRAY | Freq: Two times a day (BID) | RESPIRATORY_TRACT | 0 refills | Status: DC

## 2019-10-04 NOTE — Progress Notes (Signed)
10/04/19- 44 yoF Smoker (20 pkyrs)referred for Pulmonary evaluation due to cough, courtesy of Dr Posey Rea. Medical problem list includes Bipolar, ETOH dependence, Hx Drug dependence/ OD, PTSD  Hypokalemia SARS negative 5/17 Meds include albuterol hfa, neb albuterol Urine Drug Screen 03/25/2019- POS for cocaine, benzos, THC PFT 08/24/19- minimal obstruction with response to BD, nl volumes, nl [facility] Dec, 2020 after ED visit for self-harm, drug use. Arrival room air O2 sat 100% Says she reduced smoking cigarettes and marijuana 6/21 with Chantix. Had smoked as much as 3 PPD years ago. Cites 54 yo son as motivation for trying to take better care of herself.  More aware of dyspnea walking and stairs, cough until she retches- productive thick white/ trace yellow nonbloody sputum. Some exacerbations associated w viral URIs.  Has not had Covax. Using albuterol about 2x/daily. Has neb machine- overstimulates. Never hosp for pneumonia. Told "asthmatic bronchitis" in past.  Previous L maxillary sinusitis. Blames "allergies" seasonal for rhinitis and chest symptoms.  Offered our pharmacy team smoking cessation- not able due to living in [place] and having to pay a neighbor to bring her.   Prior to Admission medications   Medication Sig Start Date End Date Taking? Authorizing Provider  albuterol (PROVENTIL) (2.5 MG/3ML) 0.083% nebulizer solution Take 3 mLs (2.5 mg total) by nebulization 4 (four) times daily as needed for wheezing or shortness of breath. 07/19/14  Yes Armandina Stammer I, NP  albuterol (VENTOLIN HFA) 108 (90 Base) MCG/ACT inhaler Inhale 2 puffs into the lungs every 6 (six) hours as needed for wheezing or shortness of breath. 10/04/19  Yes Zyionna Pesce D, MD  alprazolam Prudy Feeler) 2 MG tablet Take 1 mg by mouth 4 (four) times daily.  10/21/17  Yes [provider]  ciclopirox (PENLAC) 8 % solution Apply 1 application topically daily.  01/26/19  Yes [provider]   DEBROX 6.5 % OTIC solution Place 4 drops into both ears 2 (two) times daily.  01/01/19  Yes [provider]  gabapentin (NEURONTIN) 100 MG capsule Take 300 mg by mouth 3 (three) times daily.  03/21/19  Yes [provider]  lamoTRIgine (LAMICTAL) 200 MG tablet Take 200 mg by mouth daily. 10/28/17  Yes [provider]  REXULTI 2 MG TABS Take 1 tablet by mouth daily. 10/28/17  Yes [provider]  traZODone (DESYREL) 100 MG tablet Take 100 mg by mouth at bedtime.  10/28/17  Yes [provider]  Budeson-Glycopyrrol-Formoterol (BREZTRI AEROSPHERE) 160-9-4.8 MCG/ACT AERO Inhale 2 puffs into the lungs in the morning and at bedtime. 10/04/19   Waymon Budge, MD   Past Medical History:  Diagnosis Date   Anxiety    Bipolar 1 disorder (HCC)    Panic attack    PTSD (post-traumatic stress disorder)    Past Surgical History:  Procedure Laterality Date   ABDOMINAL HYSTERECTOMY     TUBAL LIGATION     Family History  Problem Relation Age of Onset   Hypertension Mother    Thyroid disease Mother    Kidney disease Mother    Liver disease Neg Hx    Colon cancer Neg Hx    Social History   Socioeconomic History   Marital status: Single    Spouse name: Not on file   Number of children: Not on file   Years of education: Not on file   Highest education level: Not on file  Occupational History   Not on file  Tobacco Use   Smoking status: Former  Smoker    Packs/day: 1.00    Years: 20.00    Pack years: 20.00    Types: Cigarettes   Smokeless tobacco: Former Neurosurgeon   Tobacco comment: chantix, stopped 09/13/2019  Substance and Sexual Activity   Alcohol use: Yes   Drug use: Yes    Types: "Crack" cocaine, Marijuana    Comment: marijuana; cocaine few weeks ago (states she relapsed) as of 12/04/17   Sexual activity: Yes    Birth control/protection: Surgical    Comment: s/p tubal ligation  Other Topics Concern   Not on file  Social  History Narrative   Not on file   Social Determinants of Health   Financial Resource Strain:    Difficulty of Paying Living Expenses:   Food Insecurity:    Worried About Programme researcher, broadcasting/film/video in the Last Year:    Barista in the Last Year:   Transportation Needs:    Freight forwarder (Medical):    Lack of Transportation (Non-Medical):   Physical Activity:    Days of Exercise per Week:    Minutes of Exercise per Session:   Stress:    Feeling of Stress :   Social Connections:    Frequency of Communication with Friends and Family:    Frequency of Social Gatherings with Friends and Family:    Attends Religious Services:    Active Member of Clubs or Organizations:    Attends Engineer, structural:    Marital Status:   Intimate Partner Violence:    Fear of Current or Ex-Partner:    Emotionally Abused:    Physically Abused:    Sexually Abused:    ROS-see HPI   + = positive Constitutional:    weight loss, night sweats, fevers, chills, fatigue, lassitude. HEENT:    headaches, +difficulty swallowing, tooth/dental problems, sore throat,       +sneezing, itching, ear ache, +nasal congestion, post nasal drip, snoring CV:    +chest pain, orthopnea, PND, swelling in lower extremities, anasarca,                                  dizziness, palpitations Resp:  + shortness of breath with exertion or at rest.               + productive cough,   non-productive cough, coughing up of blood.              change in color of mucus.  wheezing.   Skin:    rash or lesions. GI:  No-   heartburn, indigestion, abdominal pain, nausea, vomiting, diarrhea,                 change in bowel habits, +loss of appetite GU: dysuria, change in color of urine, no urgency or frequency.   flank pain. MS:   joint pain, +stiffness, decreased range of motion, back pain. Neuro-     nothing unusual Psych:  change in mood or affect.  depression or +anxiety.   memory loss.  OBJ-  Physical Exam General- Alert, Oriented, Affect+ anxious/ talkative, Distress- none acute Skin- rash-none, lesions- none, excoriation- none Lymphadenopathy- none Head- atraumatic            Eyes- Gross vision intact, PERRLA, conjunctivae and secretions clear            Ears- Hearing, canals-normal  Nose- Clear, no-Septal dev, mucus, polyps, erosion, perforation             Throat- Mallampati II , mucosa clear , drainage- none, tonsils- atrophic,  + missing teeth Neck- flexible , trachea midline, no stridor , thyroid nl, carotid no bruit Chest - symmetrical excursion , unlabored           Heart/CV- RRR , no murmur , no gallop  , no rub, nl s1 s2                           - JVD- none , edema- none, stasis changes- none, varices- none           Lung- clear to P&A, wheeze- none, cough+ , dullness-none, rub- none           Chest wall-  Abd-  Br/ Gen/ Rectal- Not done, not indicated Extrem- cyanosis- none, clubbing, none, atrophy- none, strength- nl Neuro- grossly intact to observation

## 2019-10-04 NOTE — Patient Instructions (Signed)
Order- CXR  Dx asthmatic bronchitis mild persistent  Sample x 2 Breztri maintenance inhaler    Inhale 2 puffs, then rinse mouth, twice daily  Ok to continue using the albuterol rescue inhaler 2 puffs up to every 6 hours, if needed  Refill script sent for albuterol rescue inhaler    Please call as needed

## 2019-10-04 NOTE — Assessment & Plan Note (Signed)
PFT indicates better lung function than expected for her smoking history.  Plan- CXR, samples Breztri to assess potential. Likely will need something cheaper for the long run.

## 2019-10-04 NOTE — Assessment & Plan Note (Signed)
She is using Chantix now to reduce he smoking, but that works best with reinforcement. Out pharmacy team smoking cessation program would be available, but transportation from [place] is not easy now.

## 2019-10-29 ENCOUNTER — Encounter (HOSPITAL_COMMUNITY): Payer: Self-pay

## 2019-10-29 ENCOUNTER — Other Ambulatory Visit: Payer: Self-pay

## 2019-10-29 ENCOUNTER — Emergency Department (HOSPITAL_COMMUNITY)
Admission: EM | Admit: 2019-10-29 | Discharge: 2019-10-29 | Disposition: A | Discharge status: Home / Self Care | Payer: Medicaid Other | Attending: Emergency Medicine | Admitting: Emergency Medicine

## 2019-10-29 DIAGNOSIS — N898 Other specified noninflammatory disorders of vagina: Secondary | ICD-10-CM | POA: Insufficient documentation

## 2019-10-29 DIAGNOSIS — Z5321 Procedure and treatment not carried out due to patient leaving prior to being seen by health care provider: Secondary | ICD-10-CM | POA: Diagnosis not present

## 2019-10-29 NOTE — ED Triage Notes (Signed)
Pt to er, pt states that she is here to get ruled out for an std, states that she has the same partner but wants to get checked for tric.  Pt states that she has some white discharge, reports a change in vaginal smell.

## 2020-01-10 ENCOUNTER — Ambulatory Visit: Payer: Medicaid Other | Admitting: Internal Medicine
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
## 2021-07-24 ENCOUNTER — Telehealth: Payer: Self-pay | Admitting: *Deleted

## 2021-07-24 ENCOUNTER — Encounter: Payer: Self-pay | Admitting: *Deleted

## 2021-07-24 ENCOUNTER — Encounter: Payer: Self-pay | Admitting: Internal Medicine

## 2021-07-24 ENCOUNTER — Ambulatory Visit (INDEPENDENT_AMBULATORY_CARE_PROVIDER_SITE_OTHER): Payer: Medicaid Other | Admitting: Internal Medicine

## 2021-07-24 VITALS — BP 124/72 | HR 75 | Temp 96.9°F | Ht 62.0 in | Wt 163.8 lb

## 2021-07-24 DIAGNOSIS — B182 Chronic viral hepatitis C: Secondary | ICD-10-CM

## 2021-07-24 MED ORDER — PEG 3350-KCL-NA BICARB-NACL 420 G PO SOLR: ORAL | 0 refills | Status: DC

## 2021-07-24 NOTE — Telephone Encounter (Signed)
Called pt. She has been scheduled for TCS/EGD with propofol asa 3 on 5/26 at 2:30pm. Aware will mail instructions with pre-op appt. Will send prep rx to pharmacy ?

## 2021-07-24 NOTE — Patient Instructions (Signed)
It was good to see you again today! ? ?As discussed, we will proceed with an EGD and a colonoscopy to further evaluate your right upper quadrant abdominal pain and rectal bleeding (ASA 3) ? ?We need to see where you stand as far as your hepatitis C is concerned-it has been partially treated. ? ?We will check a Chem-12, CBC, HCV genotype (repeat) and HCV RNA testing ? ?Please get your hepatitis A vaccine as previously recommended ? ?Further recommendations to follow. ?
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
## 2021-08-24 NOTE — Patient Instructions (Signed)
Amy Cummings  08/24/2021     @PREFPERIOPPHARMACY @   Your procedure is scheduled on  08/31/2021.   Report to [facility] at  1245 P.M.   Call this number if you have problems the morning of surgery:  [phone]   Remember:  Follow the diet and prep instructions given to you by the office.    Use your nebulizer and your inhaler before you come and bring your rescue inhaler with you.    Take these medicines the morning of surgery with A SIP OF WATER          xanax(if needed), gabapentin, lamictal, rexulti.     Do not wear jewelry, make-up or nail polish.  Do not wear lotions, powders, or perfumes, or deodorant.  Do not shave 48 hours prior to surgery.  Men may shave face and neck.  Do not bring valuables to the hospital.  [facility] is not responsible for any belongings or valuables.  Contacts, dentures or bridgework may not be worn into surgery.  Leave your suitcase in the car.  After surgery it may be brought to your room.  For patients admitted to the hospital, discharge time will be determined by your treatment team.  Patients discharged the day of surgery will not be allowed to drive home and must  have someone with them for 24 hours.    Special instructions:   DO NOT smoke tobacco or vape for 24 hours before your procedure.  Please read over the following fact sheets that you were given. Anesthesia Post-op Instructions and Care and Recovery After Surgery      Upper Endoscopy, Adult, Care After This sheet gives you information about how to care for yourself after your procedure. Your health care provider may also give you more specific instructions. If you have problems or questions, contact your health care provider. What can I expect after the procedure? After the procedure, it is common to have: A sore throat. Mild stomach pain or discomfort. Bloating. Nausea. Follow these instructions at home:  Follow instructions from your health care  provider about what to eat or drink after your procedure. Return to your normal activities as told by your health care provider. Ask your health care provider what activities are safe for you. Take over-the-counter and prescription medicines only as told by your health care provider. If you were given a sedative during the procedure, it can affect you for several hours. Do not drive or operate machinery until your health care provider says that it is safe. Keep all follow-up visits as told by your health care provider. This is important. Contact a health care provider if you have: A sore throat that lasts longer than one day. Trouble swallowing. Get help right away if: You vomit blood or your vomit looks like coffee grounds. You have: A fever. Bloody, black, or tarry stools. A severe sore throat or you cannot swallow. Difficulty breathing. Severe pain in your chest or abdomen. Summary After the procedure, it is common to have a sore throat, mild stomach discomfort, bloating, and nausea. If you were given a sedative during the procedure, it can affect you for several hours. Do not drive or operate machinery until your health care provider says that it is safe. Follow instructions from your health care provider about what to eat or drink after your procedure. Return to your normal activities as told by your health care provider. This information is not intended to  replace advice given to you by your health care provider. Make sure you discuss any questions you have with your health care provider. Document Revised: 01/29/2019 Document Reviewed: 08/25/2017 Elsevier Patient Education  [place]. Colonoscopy, Adult, Care After The following information offers guidance on how to care for yourself after your procedure. Your health care provider may also give you more specific instructions. If you have problems or questions, contact your health care provider. What can I expect after the  procedure? After the procedure, it is common to have: A small amount of blood in your stool for 24 hours after the procedure. Some gas. Mild cramping or bloating of your abdomen. Follow these instructions at home: Eating and drinking  Drink enough fluid to keep your urine pale yellow. Follow instructions from your health care provider about eating or drinking restrictions. Resume your normal diet as told by your health care provider. Avoid heavy or fried foods that are hard to digest. Activity Rest as told by your health care provider. Avoid sitting for a long time without moving. Get up to take short walks every 1-2 hours. This is important to improve blood flow and breathing. Ask for help if you feel weak or unsteady. Return to your normal activities as told by your health care provider. Ask your health care provider what activities are safe for you. Managing cramping and bloating  Try walking around when you have cramps or feel bloated. If directed, apply heat to your abdomen as told by your health care provider. Use the heat source that your health care provider recommends, such as a moist heat pack or a heating pad. Place a towel between your skin and the heat source. Leave the heat on for 20-30 minutes. Remove the heat if your skin turns bright red. This is especially important if you are unable to feel pain, heat, or cold. You have a greater risk of getting burned. General instructions If you were given a sedative during the procedure, it can affect you for several hours. Do not drive or operate machinery until your health care provider says that it is safe. For the first 24 hours after the procedure: Do not sign important documents. Do not drink alcohol. Do your regular daily activities at a slower pace than normal. Eat soft foods that are easy to digest. Take over-the-counter and prescription medicines only as told by your health care provider. Keep all follow-up visits. This  is important. Contact a health care provider if: You have blood in your stool 2-3 days after the procedure. Get help right away if: You have more than a small spotting of blood in your stool. You have large blood clots in your stool. You have swelling of your abdomen. You have nausea or vomiting. You have a fever. You have increasing pain in your abdomen that is not relieved with medicine. These symptoms may be an emergency. Get help right away. Call 911. Do not wait to see if the symptoms will go away. Do not drive yourself to the hospital. Summary After the procedure, it is common to have a small amount of blood in your stool. You may also have mild cramping and bloating of your abdomen. If you were given a sedative during the procedure, it can affect you for several hours. Do not drive or operate machinery until your health care provider says that it is safe. Get help right away if you have a lot of blood in your stool, nausea or vomiting, a fever,  or increased pain in your abdomen. This information is not intended to replace advice given to you by your health care provider. Make sure you discuss any questions you have with your health care provider. Document Revised: 11/15/2020 Document Reviewed: 11/15/2020 Elsevier Patient Education  [place] After This sheet gives you information about how to care for yourself after your procedure. Your health care provider may also give you more specific instructions. If you have problems or questions, contact your health care provider. What can I expect after the procedure? After the procedure, it is common to have: Tiredness. Forgetfulness about what happened after the procedure. Impaired judgment for important decisions. Nausea or vomiting. Some difficulty with balance. Follow these instructions at home: For the time period you were told by your health care provider:     Rest as needed. Do not  participate in activities where you could fall or become injured. Do not drive or use machinery. Do not drink alcohol. Do not take sleeping pills or medicines that cause drowsiness. Do not make important decisions or sign legal documents. Do not take care of children on your own. Eating and drinking Follow the diet that is recommended by your health care provider. Drink enough fluid to keep your urine pale yellow. If you vomit: Drink water, juice, or soup when you can drink without vomiting. Make sure you have little or no nausea before eating solid foods. General instructions Have a responsible adult stay with you for the time you are told. It is important to have someone help care for you until you are awake and alert. Take over-the-counter and prescription medicines only as told by your health care provider. If you have sleep apnea, surgery and certain medicines can increase your risk for breathing problems. Follow instructions from your health care provider about wearing your sleep device: Anytime you are sleeping, including during daytime naps. While taking prescription pain medicines, sleeping medicines, or medicines that make you drowsy. Avoid smoking. Keep all follow-up visits as told by your health care provider. This is important. Contact a health care provider if: You keep feeling nauseous or you keep vomiting. You feel light-headed. You are still sleepy or having trouble with balance after 24 hours. You develop a rash. You have a fever. You have redness or swelling around the IV site. Get help right away if: You have trouble breathing. You have new-onset confusion at home. Summary For several hours after your procedure, you may feel tired. You may also be forgetful and have poor judgment. Have a responsible adult stay with you for the time you are told. It is important to have someone help care for you until you are awake and alert. Rest as told. Do not drive or operate  machinery. Do not drink alcohol or take sleeping pills. Get help right away if you have trouble breathing, or if you suddenly become confused. This information is not intended to replace advice given to you by your health care provider. Make sure you discuss any questions you have with your health care provider. Document Revised: 02/27/2021 Document Reviewed: 02/25/2019 Elsevier Patient Education  [place].

## 2021-08-28 ENCOUNTER — Encounter (HOSPITAL_COMMUNITY)
Admission: RE | Admit: 2021-08-28 | Discharge: 2021-08-28 | Disposition: A | Discharge status: Home / Self Care | Payer: Medicaid Other | Source: Ambulatory Visit | Attending: Internal Medicine | Admitting: Internal Medicine

## 2021-08-28 ENCOUNTER — Encounter (HOSPITAL_COMMUNITY): Payer: Self-pay

## 2021-08-28 VITALS — HR 81 | Temp 97.5°F | Resp 18 | Ht 62.0 in | Wt 162.0 lb

## 2021-08-28 DIAGNOSIS — Z01818 Encounter for other preprocedural examination: Secondary | ICD-10-CM

## 2021-08-28 DIAGNOSIS — B182 Chronic viral hepatitis C: Secondary | ICD-10-CM | POA: Diagnosis present

## 2021-08-28 HISTORY — DX: Chronic obstructive pulmonary disease, unspecified: J44.9

## 2021-08-28 HISTORY — DX: Unspecified asthma, uncomplicated: J45.909

## 2021-08-28 HISTORY — DX: Pneumonia, unspecified organism: J18.9

## 2021-08-28 HISTORY — DX: Attention-deficit hyperactivity disorder, unspecified type: F90.9

## 2021-08-28 LAB — CBC WITH DIFFERENTIAL/PLATELET
Abs Immature Granulocytes: 0.02 10*3/uL (ref 0.00–0.07)
Basophils Absolute: 0 10*3/uL (ref 0.0–0.1)
Basophils Relative: 0 %
Eosinophils Absolute: 0.2 10*3/uL (ref 0.0–0.5)
Eosinophils Relative: 2 %
HCT: 47.9 % — ABNORMAL HIGH (ref 36.0–46.0)
Hemoglobin: 16 g/dL — ABNORMAL HIGH (ref 12.0–15.0)
Immature Granulocytes: 0 %
Lymphocytes Relative: 35 %
Lymphs Abs: 3.4 10*3/uL (ref 0.7–4.0)
MCH: 32.4 pg (ref 26.0–34.0)
MCHC: 33.4 g/dL (ref 30.0–36.0)
MCV: 97 fL (ref 80.0–100.0)
Monocytes Absolute: 0.9 10*3/uL (ref 0.1–1.0)
Monocytes Relative: 10 %
Neutro Abs: 4.9 10*3/uL (ref 1.7–7.7)
Neutrophils Relative %: 53 %
Platelets: 332 10*3/uL (ref 150–400)
RBC: 4.94 MIL/uL (ref 3.87–5.11)
RDW: 12 % (ref 11.5–15.5)
WBC: 9.5 10*3/uL (ref 4.0–10.5)
nRBC: 0 % (ref 0.0–0.2)

## 2021-08-28 LAB — COMPREHENSIVE METABOLIC PANEL
ALT: 33 U/L (ref 0–44)
AST: 31 U/L (ref 15–41)
Albumin: 4.4 g/dL (ref 3.5–5.0)
Alkaline Phosphatase: 82 U/L (ref 38–126)
Anion gap: 7 (ref 5–15)
BUN: 10 mg/dL (ref 6–20)
CO2: 24 mmol/L (ref 22–32)
Calcium: 9.4 mg/dL (ref 8.9–10.3)
Chloride: 109 mmol/L (ref 98–111)
Creatinine, Ser: 0.67 mg/dL (ref 0.44–1.00)
GFR, Estimated: >60 mL/min (ref >60)
Glucose, Bld: 70 mg/dL (ref 70–99)
Potassium: 3.5 mmol/L (ref 3.5–5.1)
Sodium: 140 mmol/L (ref 135–145)
Total Bilirubin: 0.7 mg/dL (ref 0.3–1.2)
Total Protein: 7.7 g/dL (ref 6.5–8.1)

## 2021-08-28 LAB — PROTIME-INR
INR: 0.9 (ref 0.8–1.2)
Prothrombin Time: 11.6 seconds (ref 11.4–15.2)

## 2021-08-28 LAB — RAPID URINE DRUG SCREEN, HOSP PERFORMED
Amphetamines: POSITIVE — AB
Barbiturates: NOT DETECTED
Benzodiazepines: POSITIVE — AB
Cocaine: NOT DETECTED
Opiates: NOT DETECTED
Tetrahydrocannabinol: POSITIVE — AB

## 2021-08-30 ENCOUNTER — Other Ambulatory Visit: Payer: Self-pay

## 2021-08-30 DIAGNOSIS — B182 Chronic viral hepatitis C: Secondary | ICD-10-CM

## 2021-08-30 DIAGNOSIS — K921 Melena: Secondary | ICD-10-CM | POA: Clinically undetermined

## 2021-08-31 ENCOUNTER — Ambulatory Visit (HOSPITAL_COMMUNITY)
Admission: RE | Admit: 2021-08-31 | Discharge: 2021-08-31 | Disposition: A | Discharge status: Home / Self Care | Payer: Medicaid Other | Attending: Internal Medicine | Admitting: Internal Medicine

## 2021-08-31 ENCOUNTER — Ambulatory Visit (HOSPITAL_BASED_OUTPATIENT_CLINIC_OR_DEPARTMENT_OTHER): Payer: Medicaid Other | Admitting: Anesthesiology

## 2021-08-31 ENCOUNTER — Other Ambulatory Visit (HOSPITAL_COMMUNITY)
Admission: RE | Admit: 2021-08-31 | Discharge: 2021-08-31 | Disposition: A | Discharge status: Home / Self Care | Payer: Medicaid Other | Source: Ambulatory Visit | Attending: Internal Medicine | Admitting: Internal Medicine

## 2021-08-31 ENCOUNTER — Ambulatory Visit (HOSPITAL_COMMUNITY): Payer: Medicaid Other | Admitting: Anesthesiology

## 2021-08-31 ENCOUNTER — Encounter (HOSPITAL_COMMUNITY)
Admission: RE | Disposition: A | Discharge status: Home / Self Care | Payer: Self-pay | Source: Home / Self Care | Attending: Internal Medicine

## 2021-08-31 ENCOUNTER — Encounter (HOSPITAL_COMMUNITY): Payer: Self-pay | Admitting: Internal Medicine

## 2021-08-31 DIAGNOSIS — K921 Melena: Secondary | ICD-10-CM | POA: Diagnosis present

## 2021-08-31 DIAGNOSIS — F319 Bipolar disorder, unspecified: Secondary | ICD-10-CM | POA: Insufficient documentation

## 2021-08-31 DIAGNOSIS — B1920 Unspecified viral hepatitis C without hepatic coma: Secondary | ICD-10-CM | POA: Diagnosis not present

## 2021-08-31 DIAGNOSIS — K449 Diaphragmatic hernia without obstruction or gangrene: Secondary | ICD-10-CM | POA: Insufficient documentation

## 2021-08-31 DIAGNOSIS — K641 Second degree hemorrhoids: Secondary | ICD-10-CM | POA: Insufficient documentation

## 2021-08-31 DIAGNOSIS — K319 Disease of stomach and duodenum, unspecified: Secondary | ICD-10-CM | POA: Diagnosis not present

## 2021-08-31 DIAGNOSIS — R1011 Right upper quadrant pain: Secondary | ICD-10-CM | POA: Diagnosis not present

## 2021-08-31 DIAGNOSIS — Z87891 Personal history of nicotine dependence: Secondary | ICD-10-CM | POA: Insufficient documentation

## 2021-08-31 DIAGNOSIS — F418 Other specified anxiety disorders: Secondary | ICD-10-CM

## 2021-08-31 DIAGNOSIS — J449 Chronic obstructive pulmonary disease, unspecified: Secondary | ICD-10-CM | POA: Insufficient documentation

## 2021-08-31 DIAGNOSIS — F419 Anxiety disorder, unspecified: Secondary | ICD-10-CM | POA: Diagnosis not present

## 2021-08-31 DIAGNOSIS — K2950 Unspecified chronic gastritis without bleeding: Secondary | ICD-10-CM | POA: Diagnosis not present

## 2021-08-31 HISTORY — PX: ESOPHAGOGASTRODUODENOSCOPY (EGD) WITH PROPOFOL: SHX5813

## 2021-08-31 HISTORY — PX: COLONOSCOPY WITH PROPOFOL: SHX5780

## 2021-08-31 SURGERY — COLONOSCOPY WITH PROPOFOL
Anesthesia: General

## 2021-08-31 MED ORDER — PROPOFOL 10 MG/ML IV BOLUS
INTRAVENOUS | Status: AC
  Filled 2021-08-31: qty 20

## 2021-08-31 MED ORDER — LIDOCAINE HCL (CARDIAC) PF 100 MG/5ML IV SOSY
PREFILLED_SYRINGE | INTRAVENOUS | Status: DC | PRN
  Administered 2021-08-31: 60 mg via INTRAVENOUS

## 2021-08-31 MED ORDER — STERILE WATER FOR IRRIGATION IR SOLN
Status: DC | PRN
  Administered 2021-08-31: 50 mL

## 2021-08-31 MED ORDER — LACTATED RINGERS IV SOLN: INTRAVENOUS | Status: DC

## 2021-08-31 MED ORDER — PROPOFOL 500 MG/50ML IV EMUL
INTRAVENOUS | Status: DC | PRN
  Administered 2021-08-31: 175 ug/kg/min via INTRAVENOUS

## 2021-08-31 MED ORDER — PROPOFOL 10 MG/ML IV BOLUS
INTRAVENOUS | Status: DC | PRN
  Administered 2021-08-31 (×2): 40 mg via INTRAVENOUS
  Administered 2021-08-31: 50 mg via INTRAVENOUS
  Administered 2021-08-31: 20 mg via INTRAVENOUS
  Administered 2021-08-31 (×2): 40 mg via INTRAVENOUS
  Administered 2021-08-31: 50 mg via INTRAVENOUS

## 2021-08-31 MED ORDER — DEXMEDETOMIDINE (PRECEDEX) IN NS 20 MCG/5ML (4 MCG/ML) IV SYRINGE
PREFILLED_SYRINGE | INTRAVENOUS | Status: DC | PRN
  Administered 2021-08-31: 4 ug via INTRAVENOUS
  Administered 2021-08-31: 8 ug via INTRAVENOUS
  Administered 2021-08-31 (×2): 4 ug via INTRAVENOUS

## 2021-08-31 MED ORDER — PROPOFOL 1000 MG/100ML IV EMUL
INTRAVENOUS | Status: AC
  Filled 2021-08-31: qty 100

## 2021-08-31 NOTE — H&P (Signed)
@LOGO @   Primary Care Physician:  Monico Blitz, MD Primary Gastroenterologist:  Dr. Gala Romney  Pre-Procedure History & Physical: HPI:  Amy Cummings is a 46 y.o. female here for further evaluation of epigastric right upper quad abdominal pain and intermittent rectal bleeding.  No dysphagia.  Gallbladder ultrasound previously negative.  Past Medical History:  Diagnosis Date   ADHD (attention deficit hyperactivity disorder)    Anxiety    Asthma    Bipolar 1 disorder (HCC)    COPD (chronic obstructive pulmonary disease) (HCC)    Panic attack    Pneumonia    PTSD (post-traumatic stress disorder)     Past Surgical History:  Procedure Laterality Date   ABDOMINAL HYSTERECTOMY     ovarian cyst removed     TUBAL LIGATION      Prior to Admission medications   Medication Sig Start Date End Date Taking? Authorizing Provider  albuterol (PROVENTIL) (2.5 MG/3ML) 0.083% nebulizer solution Take 3 mLs (2.5 mg total) by nebulization 4 (four) times daily as needed for wheezing or shortness of breath. 07/19/14  Yes Lindell Spar I, NP  albuterol (VENTOLIN HFA) 108 (90 Base) MCG/ACT inhaler Inhale 2 puffs into the lungs every 6 (six) hours as needed for wheezing or shortness of breath. 10/04/19  Yes Young, Clinton D, MD  alprazolam Duanne Moron) 2 MG tablet Take 1 mg by mouth 4 (four) times daily.  10/21/17  Yes [provider]  Budeson-Glycopyrrol-Formoterol (BREZTRI AEROSPHERE) 160-9-4.8 MCG/ACT AERO Inhale 2 puffs into the lungs in the morning and at bedtime. 10/04/19  Yes Young, Tarri Fuller D, MD  ciclopirox (PENLAC) 8 % solution Apply 1 application. topically daily. 01/26/19  Yes [provider]  DEBROX 6.5 % OTIC solution Place 4 drops into both ears 2 (two) times daily. 01/01/19  Yes [provider]  estradiol (CLIMARA - DOSED IN MG/24 HR) 0.025 mg/24hr patch 0.025 mg once a week. 06/15/21  Yes [provider]  gabapentin (NEURONTIN) 100 MG capsule Take 300 mg by mouth 3 (three)  times daily.  03/21/19  Yes [provider]  lamoTRIgine (LAMICTAL) 200 MG tablet Take 200 mg by mouth daily. 10/28/17  Yes [provider]  polyethylene glycol-electrolytes (NULYTELY) 420 g solution As directed 07/24/21  Yes Lindzy Rupert, Cristopher Estimable, MD  QUEtiapine (SEROQUEL) 300 MG tablet Take 300 mg by mouth at bedtime.   Yes [provider]  REXULTI 2 MG TABS Take 1 tablet by mouth daily. 10/28/17  Yes [provider]  traZODone (DESYREL) 100 MG tablet Take 100 mg by mouth at bedtime. 10/28/17  Yes [provider]    Allergies as of 07/24/2021 - Review Complete 07/24/2021  Allergen Reaction Noted   Geodon [ziprasidone hydrochloride] Other (See Comments) 10/23/2010   Ziprasidone Other (See Comments) 10/23/2010    Family History  Problem Relation Age of Onset   Hypertension Mother    Thyroid disease Mother    Kidney disease Mother    Liver disease Neg Hx    Colon cancer Neg Hx     Social History   Socioeconomic History   Marital status: Single    Spouse name: Not on file   Number of children: Not on file   Years of education: Not on file   Highest education level: Not on file  Occupational History   Not on file  Tobacco Use   Smoking status: Former    Packs/day: 1.00    Years: 20.00    Pack years: 20.00    Types: Cigarettes  Smokeless tobacco: Former   Tobacco comments:    chantix, stopped 09/13/2019  Substance and Sexual Activity   Alcohol use: Not Currently   Drug use: Yes    Types: Marijuana   Sexual activity: Yes    Birth control/protection: Surgical    Comment: s/p tubal ligation  Other Topics Concern   Not on file  Social History Narrative   Not on file   Social Determinants of Health   Financial Resource Strain: Not on file  Food Insecurity: Not on file  Transportation Needs: Not on file  Physical Activity: Not on file  Stress: Not on file  Social Connections: Not on file  Intimate Partner Violence: Not on file     Review of Systems: See HPI, otherwise negative ROS  Physical Exam: BP 140/86   Temp (!) 97.5 F (36.4 C) (Oral)   Resp 18   LMP 01/10/2013   SpO2 95%  General:   Alert,  Well-developed, well-nourished, pleasant and cooperative in NAD Neck:  Supple; no masses or thyromegaly. No significant cervical adenopathy. Lungs:  Clear throughout to auscultation.   No wheezes, crackles, or rhonchi. No acute distress. Heart:  Regular rate and rhythm; no murmurs, clicks, rubs,  or gallops. Abdomen: Non-distended, normal bowel sounds.  Soft and nontender without appreciable mass or hepatosplenomegaly.  Pulses:  Normal pulses noted. Extremities:  Without clubbing or edema.  Impression/Plan: 46 year old lady with epigastric/right upper quadrant abdominal pain and intermittent rectal bleeding.  Here for EGD and colonoscopy.  Denies dysphagia.  I have offered the patient an EGD and a colonoscopy for plan.  The risks, benefits, limitations, imponderables and alternatives regarding both EGD and colonoscopy have been reviewed with the patient. Questions have been answered. All parties agreeable.       Notice: This dictation was prepared with Dragon dictation along with smaller phrase technology. Any transcriptional errors that result from this process are unintentional and may not be corrected upon review.

## 2021-08-31 NOTE — Anesthesia Postprocedure Evaluation (Signed)
Anesthesia Post Note  Patient: [name]  Procedure(s) Performed: COLONOSCOPY WITH PROPOFOL ESOPHAGOGASTRODUODENOSCOPY (EGD) WITH PROPOFOL  Patient location during evaluation: Phase II Anesthesia Type: General Level of consciousness: awake and alert and oriented Pain management: pain level controlled Vital Signs Assessment: post-procedure vital signs reviewed and stable Respiratory status: spontaneous breathing, nonlabored ventilation and respiratory function stable Cardiovascular status: blood pressure returned to baseline and stable Postop Assessment: no apparent nausea or vomiting Anesthetic complications: no   No notable events documented.   Last Vitals:  Vitals:   08/31/21 1336 08/31/21 1458  BP: 140/86 101/88  Pulse:  60  Resp: 18 (!) 22  Temp: (!) 36.4 C 36.5 C  SpO2: 95% 94%    Last Pain:  Vitals:   08/31/21 1458  TempSrc: Oral  PainSc: 0-No pain                 Ninetta Adelstein C Jet Armbrust

## 2021-08-31 NOTE — Discharge Instructions (Signed)
  Colonoscopy Discharge Instructions  Read the instructions outlined below and refer to this sheet in the next few weeks. These discharge instructions provide you with general information on caring for yourself after you leave the hospital. Your doctor may also give you specific instructions. While your treatment has been planned according to the most current medical practices available, unavoidable complications occasionally occur. If you have any problems or questions after discharge, call Dr. Jena Gauss at [phone]. ACTIVITY You may resume your regular activity, but move at a slower pace for the next 24 hours.  Take frequent rest periods for the next 24 hours.  Walking will help get rid of the air and reduce the bloated feeling in your belly (abdomen).  No driving for 24 hours (because of the medicine (anesthesia) used during the test).   Do not sign any important legal documents or operate any machinery for 24 hours (because of the anesthesia used during the test).  NUTRITION Drink plenty of fluids.  You may resume your normal diet as instructed by your doctor.  Begin with a light meal and progress to your normal diet. Heavy or fried foods are harder to digest and may make you feel sick to your stomach (nauseated).  Avoid alcoholic beverages for 24 hours or as instructed.  MEDICATIONS You may resume your normal medications unless your doctor tells you otherwise.  WHAT YOU CAN EXPECT TODAY Some feelings of bloating in the abdomen.  Passage of more gas than usual.  Spotting of blood in your stool or on the toilet paper.  IF YOU HAD POLYPS REMOVED DURING THE COLONOSCOPY: No aspirin products for 7 days or as instructed.  No alcohol for 7 days or as instructed.  Eat a soft diet for the next 24 hours.  FINDING OUT THE RESULTS OF YOUR TEST Not all test results are available during your visit. If your test results are not back during the visit, make an appointment with your caregiver to find out the  results. Do not assume everything is normal if you have not heard from your caregiver or the medical facility. It is important for you to follow up on all of your test results.  SEEK IMMEDIATE MEDICAL ATTENTION IF: You have more than a spotting of blood in your stool.  Your belly is swollen (abdominal distention).  You are nauseated or vomiting.  You have a temperature over 101.  You have abdominal pain or discomfort that is severe or gets worse throughout the day.    Internal hemorrhoids only found today  Your stomach was inflamed.  Biopsies were taken.  Further recommendations to follow pending review of pathology report  At patient request, I called Blanchard Kelch at [phone] -reviewed findings and recommendations

## 2021-08-31 NOTE — Op Note (Signed)
[facility] Patient Name: Amy Cummings Procedure Date: 08/31/2021 2:03 PM MRN: [id number] Date of Birth: April 05, 1976 Attending MD: Gennette Pac , MD CSN: [id number] Age: 46 Admit Type: Outpatient Procedure:                Upper GI endoscopy Indications:              Abdominal pain in the right upper quadrant Providers:                Gennette Pac, MD, Nena Polio, RN, Cyril Mourning, Technician Referring MD:              Medicines:                Propofol per Anesthesia Complications:            No immediate complications. Estimated Blood Loss:     Estimated blood loss was minimal. Procedure:                Pre-Anesthesia Assessment:                           - Prior to the procedure, a History and Physical                            was performed, and patient medications and                            allergies were reviewed. The patient's tolerance of                            previous anesthesia was also reviewed. The risks                            and benefits of the procedure and the sedation                            options and risks were discussed with the patient.                            All questions were answered, and informed consent                            was obtained. Prior Anticoagulants: The patient has                            taken no previous anticoagulant or antiplatelet                            agents. ASA Grade Assessment: III - A patient with                            severe systemic disease. After reviewing the risks  and benefits, the patient was deemed in                            satisfactory condition to undergo the procedure.                           After obtaining informed consent, the endoscope was                            passed under direct vision. Throughout the                            procedure, the patient's blood pressure, pulse, and                             oxygen saturations were monitored continuously. The                            GIF-[id number] ([id number]) scope was introduced through the                            mouth, and advanced to the second part of duodenum.                            The upper GI endoscopy was accomplished without                            difficulty. The patient tolerated the procedure                            well. Scope In: 2:23:49 PM Scope Out: 2:28:14 PM Total Procedure Duration: 0 hours 4 minutes 25 seconds  Findings:      The examined esophagus was normal.      A small hiatal hernia was present. Scattered gastric submucosal       petechiae and superficial erosions. No ulcer or infiltrating process.       Pylorus patent. Abnormal appearing gastric mucosa was biopsied with a       cold forceps for histology.      The duodenal bulb and second portion of the duodenum were normal. Impression:               - Normal esophagus.                           - Small hiatal hernia. Abnormal gastric mucosa of                            uncertain significance. Biopsied.                           - Normal duodenal bulb and second portion of the                            duodenum. Moderate Sedation:      Moderate (conscious) sedation was personally administered by an  anesthesia professional. The following parameters were monitored: oxygen       saturation, heart rate, blood pressure, respiratory rate, EKG, adequacy       of pulmonary ventilation, and response to care. Recommendation:           - Patient has a contact number available for                            emergencies. The signs and symptoms of potential                            delayed complications were discussed with the                            patient. Return to normal activities tomorrow.                            Written discharge instructions were provided to the                            patient.                           -  Advance diet as tolerated.                           - Continue present medications.                           - Return to my office in 2 months. Follow-up on                            pathology. May need further biliary eval UA                            shin?"i.e. HIDA with CCK challenge. See colonoscopy                            report. Procedure Code(s):        --- Professional ---                           [phone] [id number], Esophagogastroduodenoscopy, flexible,                            transoral; with biopsy, single or multiple Diagnosis Code(s):        --- Professional ---                           K44.9, Diaphragmatic hernia without obstruction or                            gangrene                           R10.11, Right upper quadrant pain CPT copyright 2019 American Medical Association. All rights reserved. The codes documented  in this report are preliminary and upon coder review may  be revised to meet current compliance requirements. Gerrit Friends. Zackarey Holleman, MD Gennette Pac, MD 08/31/2021 2:35:58 PM This report has been signed electronically. Number of Addenda: 0

## 2021-08-31 NOTE — Transfer of Care (Signed)
Immediate Anesthesia Transfer of Care Note  Patient: Amy Cummings  Procedure(s) Performed: COLONOSCOPY WITH PROPOFOL ESOPHAGOGASTRODUODENOSCOPY (EGD) WITH PROPOFOL  Patient Location: Short Stay  Anesthesia Type:General  Level of Consciousness: drowsy  Airway & Oxygen Therapy: Patient Spontanous Breathing  Post-op Assessment: Report given to RN and Post -op Vital signs reviewed and stable  Post vital signs: Reviewed and stable  Last Vitals:  Vitals Value Taken Time  BP    Temp    Pulse    Resp    SpO2      Last Pain:  Vitals:   08/31/21 1336  TempSrc: Oral  PainSc: 0-No pain      Patients Stated Pain Goal: 7 ([id number])  Complications: No notable events documented.

## 2021-08-31 NOTE — Op Note (Signed)
[facility] Patient Name: Amy Cummings Procedure Date: 08/31/2021 2:00 PM MRN: [id number] Date of Birth: 1976/03/03 Attending MD: Norvel Richards , MD CSN: [id number] Age: 46 Admit Type: Outpatient Procedure:                Colonoscopy Indications:              Hematochezia Providers:                Norvel Richards, MD, Lurline Del, RN, Casimer Bilis, Technician Referring MD:              Medicines:                Propofol per Anesthesia Complications:            No immediate complications. Estimated Blood Loss:     Estimated blood loss: none. Procedure:                Pre-Anesthesia Assessment:                           - Prior to the procedure, a History and Physical                            was performed, and patient medications and                            allergies were reviewed. The patient's tolerance of                            previous anesthesia was also reviewed. The risks                            and benefits of the procedure and the sedation                            options and risks were discussed with the patient.                            All questions were answered, and informed consent                            was obtained. Prior Anticoagulants: The patient has                            taken no previous anticoagulant or antiplatelet                            agents. ASA Grade Assessment: III - A patient with                            severe systemic disease. After reviewing the risks  and benefits, the patient was deemed in                            satisfactory condition to undergo the procedure.                           After obtaining informed consent, the colonoscope                            was passed under direct vision. Throughout the                            procedure, the patient's blood pressure, pulse, and                            oxygen saturations were  monitored continuously. The                            [id number]) scope was introduced through the                            anus and advanced to the 15 cm into the ileum. The                            colonoscopy was performed without difficulty. The                            patient tolerated the procedure well. The quality                            of the bowel preparation was adequate. Scope In: 2:37:48 PM Scope Out: 2:53:05 PM Scope Withdrawal Time: 0 hours 8 minutes 58 seconds  Total Procedure Duration: 0 hours 15 minutes 17 seconds  Findings:      The perianal and digital rectal examinations were normal.      The colon (entire examined portion) appeared normal. Distal 15 cm of TI       appeared normal      Non-bleeding internal hemorrhoids were found during retroflexion. The       hemorrhoids were moderate, medium-sized and Grade II (internal       hemorrhoids that prolapse but reduce spontaneously).      The exam was otherwise without abnormality on direct and retroflexion       views. Impression:               - The entire examined colon is normal.                           - Non-bleeding internal hemorrhoids.                           - The examination was otherwise normal on direct                            and retroflexion views.                           -  No specimens collected. I suspect benign                            anorectal bleeding from hemorrhoids. Moderate Sedation:      Moderate (conscious) sedation was personally administered by an       anesthesia professional. The following parameters were monitored: oxygen       saturation, heart rate, blood pressure, respiratory rate, EKG, adequacy       of pulmonary ventilation, and response to care. Recommendation:           - Patient has a contact number available for                            emergencies. The signs and symptoms of potential                            delayed complications were discussed  with the                            patient. Return to normal activities tomorrow.                            Written discharge instructions were provided to the                            patient.                           - Advance diet as tolerated.                           - Continue present medications.                           - Repeat colonoscopy in 10 years for screening                            purposes.                           - Return to GI office in 2 months. See EGD report. Procedure Code(s):        --- Professional ---                           [phone], Colonoscopy, flexible; diagnostic, including                            collection of specimen(s) by brushing or washing,                            when performed (separate procedure) Diagnosis Code(s):        --- Professional ---                           K64.1, Second degree hemorrhoids  K92.1, Melena (includes Hematochezia) CPT copyright 2019 American Medical Association. All rights reserved. The codes documented in this report are preliminary and upon coder review may  be revised to meet current compliance requirements. Cristopher Estimable. Koya Hunger, MD Norvel Richards, MD 08/31/2021 3:00:36 PM This report has been signed electronically. Number of Addenda: 0

## 2021-08-31 NOTE — Anesthesia Preprocedure Evaluation (Signed)
Anesthesia Evaluation  Patient identified by MRN, date of birth, ID band Patient awake    Reviewed: Allergy & Precautions, NPO status , Patient's Chart, lab work & pertinent test results  Airway Mallampati: II  TM Distance: >3 FB Neck ROM: Full    Dental  (+) Dental Advisory Given, Missing, Chipped, Poor Dentition   Pulmonary asthma , pneumonia, COPD,  COPD inhaler, former smoker,           Cardiovascular negative cardio ROS   Rhythm:Regular Rate:Normal     Neuro/Psych PSYCHIATRIC DISORDERS Anxiety Depression Bipolar Disorder negative neurological ROS     GI/Hepatic negative GI ROS, (+)     substance abuse  alcohol use and cocaine use, Hepatitis -, C  Endo/Other  negative endocrine ROS  Renal/GU negative Renal ROS  negative genitourinary   Musculoskeletal negative musculoskeletal ROS (+) narcotic dependent  Abdominal   Peds  (+) ADHD Hematology negative hematology ROS (+)   Anesthesia Other Findings   Reproductive/Obstetrics negative OB ROS                          Anesthesia Physical Anesthesia Plan  ASA: 3  Anesthesia Plan: General   Post-op Pain Management:    Induction:   PONV Risk Score and Plan:   Airway Management Planned:   Additional Equipment:   Intra-op Plan:   Post-operative Plan:   Informed Consent: I have reviewed the patients History and Physical, chart, labs and discussed the procedure including the risks, benefits and alternatives for the proposed anesthesia with the patient or authorized representative who has indicated his/her understanding and acceptance.     Dental advisory given  Plan Discussed with: CRNA and Surgeon  Anesthesia Plan Comments:        Anesthesia Quick Evaluation

## 2021-09-04 LAB — HCV RNA QUANT RFLX ULTRA OR GENOTYP
HCV RNA Qnt(log copy/mL): 6.484 log10 IU/mL
HepC Qn: 3050000 IU/mL

## 2021-09-04 LAB — HEPATITIS C GENOTYPE

## 2021-09-05 LAB — SURGICAL PATHOLOGY

## 2021-09-06 ENCOUNTER — Encounter (HOSPITAL_COMMUNITY): Payer: Self-pay | Admitting: Internal Medicine

## 2021-09-07 ENCOUNTER — Other Ambulatory Visit: Payer: Self-pay | Admitting: *Deleted

## 2021-09-07 DIAGNOSIS — R1011 Right upper quadrant pain: Secondary | ICD-10-CM

## 2021-09-10 ENCOUNTER — Other Ambulatory Visit: Payer: Self-pay

## 2021-09-10 MED ORDER — MAVYRET 100-40 MG PO TABS: ORAL_TABLET | ORAL | 1 refills | Status: DC

## 2021-09-13 ENCOUNTER — Encounter: Payer: Self-pay | Admitting: Neurology

## 2021-09-14 ENCOUNTER — Ambulatory Visit (HOSPITAL_COMMUNITY)
Admission: RE | Admit: 2021-09-14 | Discharge: 2021-09-14 | Disposition: A | Discharge status: Home / Self Care | Payer: Medicaid Other | Source: Ambulatory Visit | Attending: Internal Medicine | Admitting: Internal Medicine

## 2021-09-14 ENCOUNTER — Encounter (HOSPITAL_COMMUNITY): Payer: Self-pay

## 2021-09-14 DIAGNOSIS — R1011 Right upper quadrant pain: Secondary | ICD-10-CM | POA: Diagnosis present

## 2021-09-14 MED ORDER — SINCALIDE 5 MCG IJ SOLR
INTRAMUSCULAR | Status: AC
  Administered 2021-09-14: 1.52 ug via INTRAVENOUS
  Filled 2021-09-14: qty 5

## 2021-09-14 MED ORDER — STERILE WATER FOR INJECTION IJ SOLN
INTRAMUSCULAR | Status: AC
  Administered 2021-09-14: 1.52 mL via INTRAVENOUS
  Filled 2021-09-14: qty 10

## 2021-09-14 MED ORDER — TECHNETIUM TC 99M MEBROFENIN IV KIT
5.0000 | PACK | Freq: Once | INTRAVENOUS | Status: AC | PRN
  Administered 2021-09-14: 5.3 via INTRAVENOUS

## 2021-09-17 ENCOUNTER — Encounter: Payer: Self-pay | Admitting: Internal Medicine

## 2021-09-18 ENCOUNTER — Other Ambulatory Visit: Payer: Self-pay

## 2021-09-18 MED ORDER — HYOSCYAMINE SULFATE 0.125 MG SL SUBL: 0.1250 mg | SUBLINGUAL_TABLET | SUBLINGUAL | 6 refills | Status: AC | PRN

## 2021-09-26 ENCOUNTER — Telehealth: Payer: Self-pay

## 2021-09-26 NOTE — Telephone Encounter (Signed)
Patient's medication was verbally changed to Vosevi due to already being treated in the past. Will the same instructions apply?

## 2021-09-26 NOTE — Telephone Encounter (Signed)
This patient's hep C medication was approved. Please send the patient's instructions and lab requirements.

## 2021-09-27 ENCOUNTER — Other Ambulatory Visit: Payer: Self-pay

## 2021-10-08 NOTE — Addendum Note (Signed)
Encounter addended by: Robie Ridge A on: 10/08/2021 1:15 PM  Actions taken: Imaging Exam ended, Charge Capture section accepted

## 2021-10-16 ENCOUNTER — Ambulatory Visit: Payer: Medicaid Other | Admitting: Internal Medicine

## 2021-12-11 NOTE — Progress Notes (Deleted)
Initial neurology clinic note  SERVICE DATE: 12/13/21 SERVICE TIME: 11:00 am  Reason for Evaluation: Consultation requested by Andrey Farmer, FNP for an opinion regarding ***. My final recommendations will be communicated back to the requesting physician by way of shared medical record or letter to requesting physician via [place] mail.  HPI: This is Ms. [employer], a 46 y.o. ***-handed female with a medical history of migraine, asthma, COPD, bipolar disorder, hep C, substance abuse, current smoker*** who presents to neurology clinic with the chief complaint of ***. The patient is accompanied by ***.  *** Patient was seen by Oliver Hum at [facility]o who referred the patient to neurology for numbness and tingling in bilateral upper extremities. OA of cervical spine with radiculopathy was considered and toradol injection was given.***  On gabapentin 400 mg TID?*** Lamictal 200mg  qam?***bipolar?  The patient has not*** had similar episodes of symptoms in the past. *** Muscle bulk loss? *** Muscle pain? *** Cramps/Twitching? *** Suggestion of myotonia/difficulty relaxing after contraction? *** Fatigable weakness?*** Does strength improve after brief exercise?*** Able to brush hair/teeth without difficulty? *** Able to button shirts/use zips? *** Clumsiness/dropping grasped objects?*** Can you arise from squatted position easily? *** Able to get out of chair without using arms? *** Able to walk up steps easily? *** Use an assistive device to walk? *** Significant imbalance with walking? *** Falls?*** Any change in urine color, especially after exertion/physical activity? ***  The patient denies*** symptoms suggestive of oculobulbar weakness including diplopia, ptosis, dysphagia, poor saliva control, dysarthria/dysphonia, impaired mastication, facial weakness/droop.  There are no*** neuromuscular respiratory weakness symptoms, particularly orthopnea>dyspnea. Pseudobulbar affect is absent***. The  patient does not*** report symptoms referable to autonomic dysfunction including impaired sweating, heat or cold intolerance, excessive mucosal dryness, gastroparetic early satiety, postprandial abdominal bloating, constipation, bowel or bladder dyscontrol, erectile dysfunction*** or syncope/presyncope/orthostatic intolerance.  There are no*** complaints relating to other symptoms of small fiber modalities including paresthesia/pain.  The patient has not *** noticed any recent skin rashes nor does he*** report any constitutional symptoms like fever, night sweats, anorexia or unintentional weight loss.  EtOH use: ***  Restrictive diet? *** Family history of neuropathy/myopathy/NM disease?***  Previous labs, electrodiagnostics, and neuroimaging are summarized below, but pertinent findings include***  Any biopsy done? *** Current medications being tried for the patient's symptoms include ***  Prior medications that have been tried: ***   MEDICATIONS:  Outpatient Encounter Medications as of 12/13/2021  Medication Sig   albuterol (PROVENTIL) (2.5 MG/3ML) 0.083% nebulizer solution Take 3 mLs (2.5 mg total) by nebulization 4 (four) times daily as needed for wheezing or shortness of breath.   albuterol (VENTOLIN HFA) 108 (90 Base) MCG/ACT inhaler Inhale 2 puffs into the lungs every 6 (six) hours as needed for wheezing or shortness of breath.   ALPRAZolam (XANAX) 1 MG tablet Take 1 mg by mouth 4 (four) times daily.   DYANAVEL XR 15 MG CHER Take 1 tablet by mouth every morning.   estradiol (CLIMARA - DOSED IN MG/24 HR) 0.025 mg/24hr patch 0.025 mg once a week.   gabapentin (NEURONTIN) 400 MG capsule Take 400 mg by mouth 3 (three) times daily.   hyoscyamine (LEVSIN SL) 0.125 MG SL tablet Place 1 tablet (0.125 mg total) under the tongue every 4 (four) hours as needed.   lamoTRIgine (LAMICTAL) 200 MG tablet Take 200 mg by mouth daily.   QUEtiapine (SEROQUEL) 300 MG tablet Take 300 mg by mouth at bedtime.    REXULTI 2  MG TABS Take 1 tablet by mouth daily.   No facility-administered encounter medications on file as of 12/13/2021.    PAST MEDICAL HISTORY: Past Medical History:  Diagnosis Date   ADHD (attention deficit hyperactivity disorder)    Anxiety    Asthma    Bipolar 1 disorder (HCC)    COPD (chronic obstructive pulmonary disease) (HCC)    Panic attack    Pneumonia    PTSD (post-traumatic stress disorder)     PAST SURGICAL HISTORY: Past Surgical History:  Procedure Laterality Date   ABDOMINAL HYSTERECTOMY     COLONOSCOPY WITH PROPOFOL N/A 08/31/2021   Procedure: COLONOSCOPY WITH PROPOFOL;  Surgeon: Corbin Ade, MD;  Location: [facility];  Service: Endoscopy;  Laterality: N/A;  2:30pm, asa 3   ESOPHAGOGASTRODUODENOSCOPY (EGD) WITH PROPOFOL N/A 08/31/2021   Procedure: ESOPHAGOGASTRODUODENOSCOPY (EGD) WITH PROPOFOL;  Surgeon: Corbin Ade, MD;  Location: [facility];  Service: Endoscopy;  Laterality: N/A;   ovarian cyst removed     TUBAL LIGATION      ALLERGIES: Allergies  Allergen Reactions   Geodon [Ziprasidone Hydrochloride] Other (See Comments)    Patient states that she swallows tongue   Ziprasidone Other (See Comments)    Patient states that she swallows tongue "lock jaw"    FAMILY HISTORY: Family History  Problem Relation Age of Onset   Hypertension Mother    Thyroid disease Mother    Kidney disease Mother    Liver disease Neg Hx    Colon cancer Neg Hx     SOCIAL HISTORY: Social History   Tobacco Use   Smoking status: Former    Packs/day: 1.00    Years: 20.00    Total pack years: 20.00    Types: Cigarettes   Smokeless tobacco: Former   Tobacco comments:    chantix, stopped 09/13/2019  Substance Use Topics   Alcohol use: Not Currently   Drug use: Yes    Types: Marijuana   Social History   Social History Narrative   Not on file     OBJECTIVE: REVIEW OF SYSTEMS: ***Negative except as noted in HPI  PHYSICAL EXAM: LMP 01/10/2013    General:*** General appearance: Awake and alert. No distress. Cooperative with exam.  Skin: No obvious rash or jaundice. HEENT: Atraumatic. Anicteric. Lungs: Non-labored breathing on room air  Heart: Regular Abdomen: Soft, non tender. Extremities: No edema. No obvious deformity.  Musculoskeletal: No obvious joint swelling. Psych: Affect appropriate.  Neurological: Mental Status: Alert. Speech fluent. No pseudobulbar affect Cranial Nerves: CNII: No RAPD. Visual fields intact. CNIII, IV, VI: PERRL. No nystagmus. EOMI. CN V: Facial sensation intact bilaterally to fine touch. [place] clench strong. Jaw jerk***. CN VII: Facial muscles symmetric and strong. No ptosis at rest or after sustained upgaze***. CN VIII: Hears finger rub well bilaterally. CN IX: No hypophonia. CN X: Palate elevates symmetrically. CN XI: Full strength shoulder shrug bilaterally. CN XII: Tongue protrusion full and midline. No atrophy or fasciculations. No significant dysarthria*** Motor: Tone is ***. *** fasciculations in *** extremities. *** atrophy. No grip or percussive myotonia.  Individual muscle group testing (MRC grade out of 5):  Movement     Neck flexion ***    Neck extension ***     Right Left   Shoulder abduction *** ***   Shoulder adduction *** ***   Shoulder ext rotation *** ***   Shoulder int rotation *** ***   Elbow flexion *** ***   Elbow extension *** ***   Wrist  extension *** ***   Wrist flexion *** ***   Finger abduction - FDI *** ***   Finger abduction - ADM *** ***   Finger extension *** ***   Finger distal flexion - 2/3 *** ***   Finger distal flexion - 4/5 *** ***   Thumb flexion - FPL *** ***   Thumb abduction - APB *** ***    Hip flexion *** ***   Hip extension *** ***   Hip adduction *** ***   Hip abduction *** ***   Knee extension *** ***   Knee flexion *** ***   Dorsiflexion *** ***   Plantarflexion *** ***   Inversion *** ***   Eversion *** ***   Great  toe extension *** ***   Great toe flexion *** ***     Reflexes:  Right Left   Bicep *** ***   Tricep *** ***   BrRad *** ***   Knee *** ***   Ankle *** ***    Pathological Reflexes: Babinski: *** response bilaterally*** Hoffman: *** Troemner: *** Pectoral: *** Palmomental: *** Facial: *** Midline tap: *** Sensation: Pinprick: *** Vibration: *** Temperature: *** Proprioception: *** Coordination: Intact finger-to- nose-finger bilaterally. Romberg negative.*** Gait: Able to rise from chair with arms crossed unassisted. Normal, narrow-based gait. Able to tandem walk. Able to walk on toes and heels.***  Lab and Test Review: Internal labs: ***  External labs: HIV (03/02/20): nonreactive Syphilis screen (03/03/20): nonreactive  ***  ASSESSMENT: Chrisanne Pinkert is a 46 y.o. female who presents for evaluation of ***. *** has a relevant medical history of ***. *** neurological examination is pertinent for ***. Available diagnostic data is significant for ***. This constellation of symptoms and objective data would most likely localize to ***. ***  PLAN: -Blood work: *** ***  -Return to clinic ***  The impression above as well as the plan as outlined below were extensively discussed with the patient (in the company of ***) who voiced understanding. All questions were answered to their satisfaction.  The patient was counseled on pertinent fall precautions per the printed material provided today ***, and as noted under the "Patient Instructions" section below.  When available, results of the above investigations and possible further recommendations will be communicated to the patient via telephone/MyChart. Patient to call office if not contacted after expected testing turnaround time.   Total time spent reviewing records, interview, history/exam, documentation, and coordination of care on day of encounter:  *** min   Thank you for allowing me to participate in patient's care.   If I can answer any additional questions, I would be pleased to do so.  Kai Levins, MD   CC: Monico Blitz, MD [place] [id number]  CC: Referring provider: Marye Round, [address] [place],   [id number]

## 2021-12-13 ENCOUNTER — Ambulatory Visit: Payer: Medicaid Other | Admitting: Neurology

## 2021-12-13 ENCOUNTER — Encounter: Payer: Self-pay | Admitting: Neurology

## 2021-12-13 DIAGNOSIS — Z029 Encounter for administrative examinations, unspecified: Secondary | ICD-10-CM

## 2021-12-25 ENCOUNTER — Telehealth: Payer: Self-pay

## 2021-12-25 NOTE — Telephone Encounter (Signed)
I have reached out to this patient on multiple occasions to have her come to the office to pick up her hep c medication to start treatment. She keeps stating that she will be here on certain dates to pick it up or states that she will have someone pick it up for her but still has not. The medication was delivered on 10/10/2021.

## 2021-12-26 NOTE — Telephone Encounter (Signed)
Noted  

## 2022-01-10 ENCOUNTER — Encounter (INDEPENDENT_AMBULATORY_CARE_PROVIDER_SITE_OTHER): Payer: Self-pay | Admitting: *Deleted

## 2022-01-18 ENCOUNTER — Encounter: Payer: Self-pay | Admitting: Neurology

## 2022-02-13 NOTE — Progress Notes (Deleted)
Initial neurology clinic note  SERVICE DATE: 02/20/22  Reason for Evaluation: Consultation requested by Kirstie Peri, MD for an opinion regarding numbness and tingling of bilateral upper extremities. My final recommendations will be communicated back to the requesting physician by way of shared medical record or letter to requesting physician via [place] mail.  HPI: This is [name], a 46 y.o. right-handed female with a medical history of cervicalgia,, COPD, bipolar, hep C, migraine, asthma, smoker, substance abuse*** who presents to neurology clinic with the chief complaint of numbness and tingling of bilateral upper extremities. The patient is accompanied by ***.  ***  Long standing symptoms Tried PT Has shoulder and arm pain; numbness and paresthesias that wake her up at night NSAIDs and Tylenol tried; gabapentin 400 mg TID; lamictal 200 mg daily, seroquel 200 mg TID Chronic cervicalgia  The patient has not*** had similar episodes of symptoms in the past. ***  Muscle bulk loss? *** Muscle pain? ***  Cramps/Twitching? *** Suggestion of myotonia/difficulty relaxing after contraction? ***  Fatigable weakness?*** Does strength improve after brief exercise?***  Able to brush hair/teeth without difficulty? *** Able to button shirts/use zips? *** Clumsiness/dropping grasped objects?*** Can you arise from squatted position easily? *** Able to get out of chair without using arms? *** Able to walk up steps easily? *** Use an assistive device to walk? *** Significant imbalance with walking? *** Falls?*** Any change in urine color, especially after exertion/physical activity? ***  The patient denies*** symptoms suggestive of oculobulbar weakness including diplopia, ptosis, dysphagia, poor saliva control, dysarthria/dysphonia, impaired mastication, facial weakness/droop.  There are no*** neuromuscular respiratory weakness symptoms, particularly orthopnea>dyspnea.   Pseudobulbar affect  is absent***.  The patient does not*** report symptoms referable to autonomic dysfunction including impaired sweating, heat or cold intolerance, excessive mucosal dryness, gastroparetic early satiety, postprandial abdominal bloating, constipation, bowel or bladder dyscontrol, erectile dysfunction*** or syncope/presyncope/orthostatic intolerance.  There are no*** complaints relating to other symptoms of small fiber modalities including paresthesia/pain.  The patient has not *** noticed any recent skin rashes nor does he*** report any constitutional symptoms like fever, night sweats, anorexia or unintentional weight loss.  EtOH use: ***  Restrictive diet? *** Family history of neuropathy/myopathy/NM disease?***  Previous labs, electrodiagnostics, and neuroimaging are summarized below, but pertinent findings include***  Any biopsy done? *** Current medications being tried for the patient's symptoms include ***  Prior medications that have been tried: ***   MEDICATIONS:  Outpatient Encounter Medications as of 02/20/2022  Medication Sig   albuterol (PROVENTIL) (2.5 MG/3ML) 0.083% nebulizer solution Take 3 mLs (2.5 mg total) by nebulization 4 (four) times daily as needed for wheezing or shortness of breath.   albuterol (VENTOLIN HFA) 108 (90 Base) MCG/ACT inhaler Inhale 2 puffs into the lungs every 6 (six) hours as needed for wheezing or shortness of breath.   ALPRAZolam (XANAX) 1 MG tablet Take 1 mg by mouth 4 (four) times daily.   DYANAVEL XR 15 MG CHER Take 1 tablet by mouth every morning.   estradiol (CLIMARA - DOSED IN MG/24 HR) 0.025 mg/24hr patch 0.025 mg once a week.   gabapentin (NEURONTIN) 400 MG capsule Take 400 mg by mouth 3 (three) times daily.   hyoscyamine (LEVSIN SL) 0.125 MG SL tablet Place 1 tablet (0.125 mg total) under the tongue every 4 (four) hours as needed.   lamoTRIgine (LAMICTAL) 200 MG tablet Take 200 mg by mouth daily.   QUEtiapine (SEROQUEL) 300 MG tablet Take 300  mg by mouth  at bedtime.   REXULTI 2 MG TABS Take 1 tablet by mouth daily.   No facility-administered encounter medications on file as of 02/20/2022.    PAST MEDICAL HISTORY: Past Medical History:  Diagnosis Date   ADHD (attention deficit hyperactivity disorder)    Anxiety    Asthma    Bipolar 1 disorder (HCC)    COPD (chronic obstructive pulmonary disease) (HCC)    Panic attack    Pneumonia    PTSD (post-traumatic stress disorder)     PAST SURGICAL HISTORY: Past Surgical History:  Procedure Laterality Date   ABDOMINAL HYSTERECTOMY     COLONOSCOPY WITH PROPOFOL N/A 08/31/2021   Procedure: COLONOSCOPY WITH PROPOFOL;  Surgeon: Corbin Ade, MD;  [facility];  Service: Endoscopy;  Laterality: N/A;  2:30pm, asa 3   ESOPHAGOGASTRODUODENOSCOPY (EGD) WITH PROPOFOL N/A 08/31/2021   Procedure: ESOPHAGOGASTRODUODENOSCOPY (EGD) WITH PROPOFOL;  Surgeon: Corbin Ade, MD;  [facility];  Service: Endoscopy;  Laterality: N/A;   ovarian cyst removed     TUBAL LIGATION      ALLERGIES: Allergies  Allergen Reactions   Geodon [Ziprasidone Hydrochloride] Other (See Comments)    Patient states that she swallows tongue   Ziprasidone Other (See Comments)    Patient states that she swallows tongue "lock jaw"    FAMILY HISTORY: Family History  Problem Relation Age of Onset   Hypertension Mother    Thyroid disease Mother    Kidney disease Mother    Liver disease Neg Hx    Colon cancer Neg Hx     SOCIAL HISTORY: Social History   Tobacco Use   Smoking status: Former    Packs/day: 1.00    Years: 20.00    Total pack years: 20.00    Types: Cigarettes   Smokeless tobacco: Former   Tobacco comments:    chantix, stopped 09/13/2019  Substance Use Topics   Alcohol use: Not Currently   Drug use: Yes    Types: Marijuana   Social History   Social History Narrative   Not on file     OBJECTIVE: PHYSICAL EXAM: LMP 01/10/2013   General:*** General  appearance: Awake and alert. No distress. Cooperative with exam.  Skin: No obvious rash or jaundice. HEENT: Atraumatic. Anicteric. Lungs: Non-labored breathing on room air  Heart: Regular Abdomen: Soft, non tender. Extremities: No edema. No obvious deformity.  Musculoskeletal: No obvious joint swelling. Psych: Affect appropriate.  Neurological: Mental Status: Alert. Speech fluent. No pseudobulbar affect Cranial Nerves: CNII: No RAPD. Visual fields grossly intact. CNIII, IV, VI: PERRL. No nystagmus. EOMI. CN V: Facial sensation intact bilaterally to fine touch. Masseter clench strong. Jaw jerk***. CN VII: Facial muscles symmetric and strong. No ptosis at rest or after sustained upgaze***. CN VIII: Hearing grossly intact bilaterally. CN IX: No hypophonia. CN X: Palate elevates symmetrically. CN XI: Full strength shoulder shrug bilaterally. CN XII: Tongue protrusion full and midline. No atrophy or fasciculations. No significant dysarthria*** Motor: Tone is ***. *** fasciculations in *** extremities. *** atrophy. No grip or percussive myotonia.***  Individual muscle group testing (MRC grade out of 5):  Movement     Neck flexion ***    Neck extension ***     Right Left   Shoulder abduction *** ***   Shoulder adduction *** ***   Shoulder ext rotation *** ***   Shoulder int rotation *** ***   Elbow flexion *** ***   Elbow extension *** ***   Wrist extension *** ***  Wrist flexion *** ***   Finger abduction - FDI *** ***   Finger abduction - ADM *** ***   Finger extension *** ***   Finger distal flexion - 2/3 *** ***   Finger distal flexion - 4/5 *** ***   Thumb flexion - FPL *** ***   Thumb abduction - APB *** ***    Hip flexion *** ***   Hip extension *** ***   Hip adduction *** ***   Hip abduction *** ***   Knee extension *** ***   Knee flexion *** ***   Dorsiflexion *** ***   Plantarflexion *** ***   Inversion *** ***   Eversion *** ***   Great toe extension  *** ***   Great toe flexion *** ***     Reflexes:  Right Left   Bicep *** ***   Tricep *** ***   BrRad *** ***   Knee *** ***   Ankle *** ***    Pathological Reflexes: Babinski: *** response bilaterally*** Hoffman: *** Troemner: *** Pectoral: *** Palmomental: *** Facial: *** Midline tap: *** Sensation: Pinprick: *** Vibration: *** Temperature: *** Proprioception: *** Coordination: Intact finger-to- nose-finger bilaterally. Romberg negative.*** Gait: Able to rise from chair with arms crossed unassisted. Normal, narrow-based gait. Able to tandem walk. Able to walk on toes and heels.***  Lab and Test Review: Internal labs: Normal or unremarkable: CBC, CMP ***  External labs: ***  ***  ASSESSMENT: Amy Cummings is a 46 y.o. female who presents for evaluation of ***. *** has a relevant medical history of ***. *** neurological examination is pertinent for ***. Available diagnostic data is significant for ***. This constellation of symptoms and objective data would most likely localize to ***. ***  PLAN: -Blood work: *** ***  -Return to clinic ***  The impression above as well as the plan as outlined below were extensively discussed with the patient (in the company of ***) who voiced understanding. All questions were answered to their satisfaction.  The patient was counseled on pertinent fall precautions per the printed material provided today, and as noted under the "Patient Instructions" section below.***  When available, results of the above investigations and possible further recommendations will be communicated to the patient via telephone/MyChart. Patient to call office if not contacted after expected testing turnaround time.   Total time spent reviewing records, interview, history/exam, documentation, and coordination of care on day of encounter:  *** min   Thank you for allowing me to participate in patient's care.  If I can answer any additional questions, I  would be pleased to do so.  [name], MD   CC: Kirstie Peri, MD [address] [place] [address]  CC: Referring provider: Kirstie Peri, MD [address] [place],  [place] [address]

## 2022-02-20 ENCOUNTER — Ambulatory Visit: Payer: Medicaid Other | Admitting: Neurology

## 2022-02-26 ENCOUNTER — Encounter (INDEPENDENT_AMBULATORY_CARE_PROVIDER_SITE_OTHER): Payer: Self-pay | Admitting: *Deleted

## 2023-01-23 ENCOUNTER — Emergency Department (HOSPITAL_COMMUNITY): Payer: MEDICAID

## 2023-01-23 ENCOUNTER — Emergency Department (HOSPITAL_COMMUNITY)
Admission: EM | Admit: 2023-01-23 | Discharge: 2023-01-23 | Disposition: A | Discharge status: Home / Self Care | Payer: MEDICAID

## 2023-01-23 ENCOUNTER — Encounter (HOSPITAL_COMMUNITY): Payer: Self-pay | Admitting: *Deleted

## 2023-01-23 ENCOUNTER — Other Ambulatory Visit: Payer: Self-pay

## 2023-01-23 DIAGNOSIS — R464 Slowness and poor responsiveness: Secondary | ICD-10-CM | POA: Insufficient documentation

## 2023-01-23 DIAGNOSIS — T50901A Poisoning by unspecified drugs, medicaments and biological substances, accidental (unintentional), initial encounter: Secondary | ICD-10-CM | POA: Diagnosis present

## 2023-01-23 LAB — CBG MONITORING, ED: Glucose-Capillary: 111 mg/dL — ABNORMAL HIGH (ref 70–99)

## 2023-01-23 MED ORDER — NALOXONE HCL 2 MG/2ML IJ SOSY: PREFILLED_SYRINGE | INTRAMUSCULAR | 2 refills | Status: AC

## 2023-01-23 MED ORDER — NALOXONE HCL 4 MG/0.1ML NA LIQD
1.0000 | Freq: Once | NASAL | Status: AC
  Administered 2023-01-23: 1 via NASAL

## 2023-01-23 MED ORDER — NALOXONE HCL 4 MG/0.1ML NA LIQD
NASAL | Status: AC
  Filled 2023-01-23: qty 4

## 2023-01-23 NOTE — ED Provider Notes (Signed)
[place] EMERGENCY DEPARTMENT AT [facility] Provider Note   CSN: [id number] Arrival date & time: 01/23/23  1355     History  Chief Complaint  Patient presents with   Drug Overdose    Amy Cummings is a 47 y.o. female.  Presenting emergency department by EMS with reported overdose.  She was reportedly unresponsive and given Narcan with improvement of her mentation and respiratory status.  No reported hypoxia.t was poorly.  Reportedly takes fentanyl and bleed.  Also reported to EMS that she took Xanax as well.  Nurse reports that she was quite agitated and wanted to leave when she first arrived, however was able to get into bed and is now somnolent.  She will awake, but quickly fall back asleep    Drug Overdose       Home Medications Prior to Admission medications   Medication Sig Start Date End Date Taking? Authorizing Provider  Acetaminophen Extra Strength 500 MG TABS Take 2 tablets by mouth every 6 (six) hours as needed. 11/12/22  Yes [provider]  naloxone Jonelle Sports) 2 MG/2ML injection Inject once for overdose of drugs / trouble breathing 01/23/23  Yes Eber Hong, MD  Oxycodone HCl 10 MG TABS Take 10 mg by mouth 4 (four) times daily as needed. 10/11/22  Yes [provider]  albuterol (PROVENTIL) (2.5 MG/3ML) 0.083% nebulizer solution Take 3 mLs (2.5 mg total) by nebulization 4 (four) times daily as needed for wheezing or shortness of breath. 07/19/14   Armandina Stammer I, NP  albuterol (VENTOLIN HFA) 108 (90 Base) MCG/ACT inhaler Inhale 2 puffs into the lungs every 6 (six) hours as needed for wheezing or shortness of breath. 10/04/19   Denijah Karrer, Rennis Chris, MD  ALPRAZolam Prudy Feeler) 1 MG tablet Take 1 mg by mouth 4 (four) times daily. 09/21/21   [provider]  DYANAVEL XR 15 MG CHER Take 1 tablet by mouth every morning. 08/15/21   [provider]  estradiol (CLIMARA - DOSED IN MG/24 HR) 0.025 mg/24hr patch 0.025 mg once a week. 06/15/21    [provider]  gabapentin (NEURONTIN) 400 MG capsule Take 400 mg by mouth 3 (three) times daily. 08/14/21   [provider]  hyoscyamine (LEVSIN SL) 0.125 MG SL tablet Place 1 tablet (0.125 mg total) under the tongue every 4 (four) hours as needed. 09/18/21   Rourk, Gerrit Friends, MD  ibuprofen (ADVIL) 800 MG tablet Take 800 mg by mouth every 8 (eight) hours as needed for mild pain (pain score 1-3). 01/13/23   [provider]  lamoTRIgine (LAMICTAL) 200 MG tablet Take 200 mg by mouth daily. 10/28/17   [provider]  QUEtiapine (SEROQUEL) 300 MG tablet Take 300 mg by mouth at bedtime.    [provider]  REXULTI 2 MG TABS Take 1 tablet by mouth daily. 10/28/17   [provider]      Allergies    Geodon [ziprasidone hydrochloride]    Review of Systems   Review of Systems  Physical Exam Updated Vital Signs BP 121/68   Pulse (!) 52   Temp 97.6 F (36.4 C) (Axillary)   Resp 10   Ht 5\' 1"  (1.549 m)   Wt 62.6 kg   LMP 01/10/2013   SpO2 98%   BMI 26.07 kg/m  Physical Exam  ED Results / Procedures / Treatments   Labs (all labs ordered are listed, but only abnormal results are displayed) Labs Reviewed  CBG MONITORING, ED - Abnormal; Notable for  the following components:      Result Value   Glucose-Capillary 111 (*)    All other components within normal limits    EKG EKG Interpretation Date/Time:  Thursday January 23 2023 14:10:03 EDT Ventricular Rate:  58 PR Interval:  150 QRS Duration:  85 QT Interval:  472 QTC Calculation: 464 R Axis:   53  Text Interpretation: Sinus rhythm Low voltage, precordial leads Confirmed by Estanislado Pandy [phone]) on 01/23/2023 4:22:33 PM  Radiology No results found.  Procedures Procedures    Medications Ordered in ED Medications  naloxone (NARCAN) nasal spray 4 mg/0.1 mL (1 spray Nasal Provided for home use 01/23/23 1712)    ED Course/ Medical Decision Making/ A&P                                  Medical Decision Making 47 year old female present emergency department with reported overdose on fentanyl status post Narcan.  Maintaining oxygen saturation with supplemental oxygen. Plan to observe in the emergency department for clinical sobriety. Care signed out to afternoon team.   Amount and/or Complexity of Data Reviewed Radiology: ordered.    Details: CXR without pna.           Final Clinical Impression(s) / ED Diagnoses Final diagnoses:  Accidental overdose, initial encounter    Rx / DC Orders ED Discharge Orders          Ordered    naloxone [facility]) 2 MG/2ML injection        01/23/23 1817              Coral Spikes, DO 01/31/23 1507

## 2023-01-23 NOTE — ED Triage Notes (Signed)
Pt BIB [facility] for overdose; pt was found unresponsive at [facility] and first responders and the staff at [facility] administered narcan 8mg  intranasally and pt became alert   Pt states she wants detox from fentanyl and "weed"  Pt states she took 2 "blue" xanax to help take the edge off cause she is going to detox and she needed to sleep

## 2023-01-23 NOTE — ED Notes (Signed)
Pt SPO2 dropped to 88% on RA once asleep. Pt put on 2L of O2 and SPO2 came back to 96%. RN notified.

## 2023-01-23 NOTE — ED Notes (Signed)
Narcan administered.

## 2023-01-23 NOTE — ED Notes (Signed)
Pt refuses IV and states she's not staying,.

## 2023-01-23 NOTE — Discharge Instructions (Signed)
Please stop using Xanax in any illegal way, take only as much as you need and take no more than 1 tablet every 12 hours as needed for severe anxiety.  You have overdosed on medication today that would likely have killed you if you were not treated with the appropriate medications.  I have prescribed a medication called Narcan for you, please make sure that you have this with you and that your significant other or whoever you are with knows how to use it if you choose to continue to use drugs.  I recommend that you see the list below for resources for substance abuse  Emergency department for severe worsening symptoms  Substance Abuse Treatment Programs  Intensive Outpatient Programs [facility]     [address]      [place], [place]                   [phone]       [facility] [address] [place] [address] [place], [place] [phone]  [facility]     (Inpatient and outpatient)     [address]           [phone]    [facility] [phone] (Suboxone and Methadone)  [address]      [place], [place] [address]      [phone]       [address] [place], [place] [phone]  [facility] (Outpatient/Inpatient, [facility])    (insurance only) [phone]             [facility] (Groups & Residential) [place], [place] [phone]     [facility]     [address]     [place], [place]      [phone]       [facility] (for caregivers and family) [phone] [place], [place] [phone]      Residential Treatment Programs [facility]      [address], [place], [place] [address]  [phone]       [facility] [address], [place], [place] [address] [phone]  [facility]        [phone]       [facility] [phone]  [facility]             [address]                                          [place], [place]                                                [phone] or [phone]                               [facility] [place] [address] [place], [address] [phone]  [facility]    [address]      [place], [place]     [phone]       [facility] [address] [place], [place] [phone]  [facility]   [address] [place]  [place], [place] [address]     [phone]      Admissions: 8am-3pm M-F  [facility] (RTS) [address] [place], [place] [phone]  [facility]: Residential Program ([address]   [place], [place]      [phone] or [phone]     [facility]: [facility] [place], [place] (Walk in Hours over the weekend or by referral)  [facility] [address] [place], [place], [place] [address] [phone]  Crisis Mobile: [facility]:  [phone] (for crisis response 24 hours a day) [facility] Hotline:      [phone] Outpatient Psychiatry and Counseling  [facility]: Mobile Crisis Management 24 hours:  [phone]  [facility] [employer] sliding scale fee and walk in schedule: M-F 8am-12pm/1pm-3pm [address]  [place], [place] [address] [phone]  [facility] [address] [place], [place] [address] [phone]  [facility] ([facility] [employer])- new patient walk-in appointments available Monday - Friday 8am -3pm.          [address] [place], [place] [address] [phone] or crisis line- [phone]  [facility] Outpatient Services/ Intensive Outpatient Therapy Program [address] [place], [place] [address] [phone]  [facility]                  Crisis Services      [phone] [address]     [place], [place] [address]                  [facility]   [facility] [phone]. [address] [place], [place] [address]   [employer] [facility]          [address]  [place], [place] [address]       [phone]  [facility] [address] [place], [place] [address] [phone]  [facility]    [address]    [place], [place] [address]     [phone]       Andee Poles, MD     [address]     [place] [address]     [phone]       [facility] [address] [place] [address]  [facility]     [address] [place], [place]      [phone]       [facility] Eulogio Ditch, MD [address] [place], [place] [address] [phone]  [facility]     [address]     [place], [place] [address]     [phone]       [facility] [address] [place], [place] [address] [phone] Resources for Temporary Residential Assistance/Crisis Centers  DAY CENTERS [facility]) M-F 8am-3pm   [address] [place], [place] [address]   [phone] Services include: laundry, barbering, support groups, case management, phone  & computer access, showers, AA/NA mtgs, mental health/substance abuse nurse, job skills class, disability information, VA assistance, spiritual classes, etc.   HOMELESS SHELTERS  [facility]     [facility]   [address] [place]     [phone]              [employer] (  women and children)       [address] [place], [place] [address] [phone] [email or web address] for application and process Application Required  [facility]   [address]    [place] [address]     [phone]                    [facility] [address] [place], [place] [address] [phone] application appt.) Application Required  [facility] (women only)    [address]     [place], [place] [address]     [phone]      Intake starts 6pm daily Need valid ID, SSC, & Police report [employer] [address] [place], [place] [phone] Application Required  [employer] (men only)     [address]      [place], [place]     [phone]       [facility] (Pregnant women only) [address] [place], [place] [phone]  [facility]      [address]      [place], [place] [address]     [phone]             [facility] [address] [place], [place] [phone] 90 day commitment/SA/Application process  [facility](men only)     [address]     [place], [place]     [phone]       Check-in at [facility] [address] [place], [place] [address] [phone] Men/Women/Women and Children must be there by 7 pm  [facility] [place], [place] [phone]

## 2023-01-23 NOTE — ED Provider Notes (Signed)
Patient arrives after overdosing on a combination of fentanyl and Xanax, initially responded to Narcan, on my exam the patient was somnolent and difficult to arouse, she was given another dose of fentanyl, this caused her to wake up, she ripped out her IV and walked back and forth to the bathroom wanting to go.  When I examined the patient she states that she wants to go home, appears to have her medical decision making capacity.  She does endorse using Xanax on top of the fentanyl which is likely participating in part of her somnolence.  She is calling for a ride at this time.  She states that she was not tried to kill her self,  I have reviewed her medication list, she is on Xanax at home and has also been prescribed oxycodone, most recently in September and it was 10 tablets.   Eber Hong, MD 01/23/23 [phone]

## 2023-01-23 NOTE — ED Notes (Signed)
Pt alert and ambulating in ED. Pt informed she's been discharged. Pt's contact Linda notified. Bonita Quin coming to pick up pt. Pt would not allow another set of vitals to be obtained.

## 2023-01-23 NOTE — ED Notes (Signed)
Pt assisted to bathroom. Ambulated with assistance. Pt asked for warm blankets and food.

## 2023-07-31 IMAGING — NM NM HEPATO W/ EF
2 series · 12 of 12 positions shown · non-contrast
Comparison: ultrasound April 13, 2018

CLINICAL DATA: Chronic abdominal pain worsening over the last few
weeks.

EXAM:
NUCLEAR MEDICINE HEPATOBILIARY IMAGING WITH GALLBLADDER EF
TECHNIQUE: Sequential images of the abdomen were obtained [DATE] minutes
following intravenous administration of radiopharmaceutical. After
slow intravenous infusion of 1.52 micrograms Cholecystokinin,
gallbladder ejection fraction was determined.
RADIOPHARMACEUTICALS:  5.3  mCi Ic-88m Choletec IV

[Series 1: biliary · 3.25mm/px · 6 of 60 frames shown]
[frame 6/60]
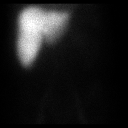
[frame 16/60]
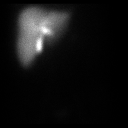
[frame 26/60]
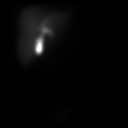
[frame 36/60]
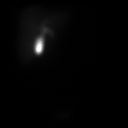
[frame 46/60]
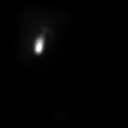
[frame 56/60]
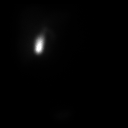

[Series 2: gbef · 3.25mm/px · 6 of 60 frames shown]
[frame 6/60]
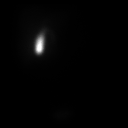
[frame 16/60]
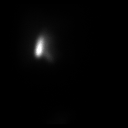
[frame 26/60]
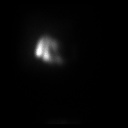
[frame 36/60]
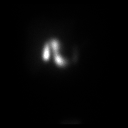
[frame 46/60]
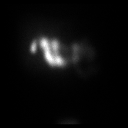
[frame 56/60]
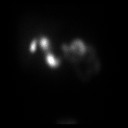

[12 of 12 positions shown; findings below may reference images not displayed]

FINDINGS: Prompt uptake and biliary excretion of activity by the liver is
seen. Gallbladder activity is visualized, consistent with patency of
cystic duct. Biliary activity passes into small bowel, consistent
with patent common bile duct.

Calculated gallbladder ejection fraction is 86%. (At 60 min, normal
ejection fraction is greater than 40%.)
IMPRESSION: 1.  Patent cystic and common bile ducts.

2.  Normal gallbladder ejection fraction.
